# Patient Record
Sex: Male | Born: 1940 | Race: Black or African American | Hispanic: No | Marital: Single | Smoking: Never smoker
Health system: Southern US, Community
[De-identification: ages and names within clinical notes are randomized; demographics above are authoritative.]

## PROBLEM LIST (undated history)

## (undated) DIAGNOSIS — R569 Unspecified convulsions: Secondary | ICD-10-CM

## (undated) DIAGNOSIS — N390 Urinary tract infection, site not specified: Secondary | ICD-10-CM

## (undated) HISTORY — DX: Urinary tract infection, site not specified: N39.0

## (undated) HISTORY — DX: Unspecified convulsions: R56.9

---

## 1989-05-07 DIAGNOSIS — R569 Unspecified convulsions: Secondary | ICD-10-CM

## 1989-05-07 HISTORY — DX: Unspecified convulsions: R56.9

## 2005-07-06 ENCOUNTER — Ambulatory Visit: Payer: Self-pay

## 2005-12-03 ENCOUNTER — Ambulatory Visit: Payer: Self-pay | Admitting: Cardiology

## 2006-03-01 ENCOUNTER — Ambulatory Visit: Payer: Self-pay | Admitting: Cardiology

## 2006-03-15 ENCOUNTER — Encounter: Payer: Self-pay | Admitting: Cardiology

## 2006-03-15 ENCOUNTER — Ambulatory Visit: Payer: Self-pay

## 2006-04-17 ENCOUNTER — Ambulatory Visit: Payer: Self-pay | Admitting: Cardiology

## 2006-08-30 ENCOUNTER — Ambulatory Visit: Payer: Self-pay | Admitting: Cardiology

## 2007-02-21 ENCOUNTER — Ambulatory Visit: Payer: Self-pay | Admitting: Cardiology

## 2008-02-20 ENCOUNTER — Ambulatory Visit: Payer: Self-pay | Admitting: Cardiology

## 2009-01-18 ENCOUNTER — Encounter (INDEPENDENT_AMBULATORY_CARE_PROVIDER_SITE_OTHER): Payer: Self-pay | Admitting: *Deleted

## 2009-03-02 DIAGNOSIS — D649 Anemia, unspecified: Secondary | ICD-10-CM

## 2009-03-02 DIAGNOSIS — E119 Type 2 diabetes mellitus without complications: Secondary | ICD-10-CM

## 2009-03-02 DIAGNOSIS — I10 Essential (primary) hypertension: Secondary | ICD-10-CM | POA: Insufficient documentation

## 2009-03-03 ENCOUNTER — Encounter: Payer: Self-pay | Admitting: Cardiology

## 2009-03-03 DIAGNOSIS — E785 Hyperlipidemia, unspecified: Secondary | ICD-10-CM

## 2009-03-03 DIAGNOSIS — I2510 Atherosclerotic heart disease of native coronary artery without angina pectoris: Secondary | ICD-10-CM | POA: Insufficient documentation

## 2009-03-04 ENCOUNTER — Ambulatory Visit: Payer: Self-pay | Admitting: Cardiology

## 2009-05-09 ENCOUNTER — Ambulatory Visit: Payer: Self-pay | Admitting: Cardiology

## 2009-05-09 ENCOUNTER — Encounter: Payer: Self-pay | Admitting: Cardiology

## 2009-05-09 ENCOUNTER — Ambulatory Visit: Payer: Self-pay

## 2009-05-09 ENCOUNTER — Ambulatory Visit (HOSPITAL_COMMUNITY): Admission: RE | Admit: 2009-05-09 | Discharge: 2009-05-09 | Payer: Self-pay | Admitting: Cardiology

## 2009-05-20 ENCOUNTER — Telehealth (INDEPENDENT_AMBULATORY_CARE_PROVIDER_SITE_OTHER): Payer: Self-pay | Admitting: *Deleted

## 2010-06-06 NOTE — Progress Notes (Signed)
  Phone Note Other Incoming   Caller: Okey Regal Reason for Call: Get patient information Action Taken: Information Sent Initial call taken by: Denny Peon    Faxed 12 lead over to [facility] office to fax [phone] [facility]  May 20, 2009 12:59 PM

## 2010-09-19 NOTE — Assessment & Plan Note (Signed)
[facility]ALTHCARE                            CARDIOLOGY OFFICE NOTE   KYION, GAUTIER                      MRN:          [id number]  DATE:02/20/2008                            DOB:          05/27/1940    Mr. Eric Grant is here for the followup of his left ventricular  dysfunction, hypertension, and hypercholesterolemia.  His cholesterol is  being treated aggressively by Dr. Lucianne Muss.  His hypertension is under good  control with his current medicines.  He is not having any significant  symptoms.  He does have some left ventricular dysfunction.  We are aware  of this.  He has never want to undergo coronary angiography.  He has  suspected coronary artery disease.  We are treating him as such.  He  does not have chest pain.  There is no shortness of breath.  There has  been no syncope or presyncope.   PAST MEDICAL HISTORY:   ALLERGIES:  No known drug allergies.   MEDICATIONS:  1. NovoLog.  2. Lantus.  3. ACTOplus met.  4. Lipitor.  5. Aspirin.  6. Carvedilol 12.5 b.i.d.  7. Iron.  8. Diovan.  9. Hydrochlorothiazide.  10.Nexium.   OTHER MEDICAL PROBLEMS:  See the list on the note of February 21, 2007.   REVIEW OF SYSTEMS:  He has no GI or GU symptoms.  He has no  musculoskeletal problems.  He has no fevers or chills.  Otherwise review  of systems is negative.   PHYSICAL EXAMINATION:  VITAL SIGNS:  Blood pressure is 122/72 with pulse  of 58.  GENERAL:  The patient is oriented to person, time, and place.  Affect is  normal.  HEENT:  No xanthelasma.  He has normal extraocular motion.  NECK:  There are no carotid bruits.  There is no jugular venous  distention.  LUNGS:  Clear.  Respiratory effort is not labored.  CARDIAC:  S1 and S2.  There are no clicks or significant murmurs.  ABDOMEN:  Soft.  He has no significant peripheral edema.   PROBLEMS:  1. History of anemia and guaiac negative stool by Dr. Lucianne Muss in the      past.  2. Hypertension  treated.  3. Smoking, stopped in 1990.  4. Diabetes, treated.  5. Hypercholesterolemia, treated.  6. History of ejection fraction 40%.  The patient is on all of the      appropriate medicines.  I have chosen not to repeat his echo at      this time.  7. History of a Myoview in 2007, suggesting some inferolateral scar.      We are assuming coronary artery disease.  We have had many      discussions and the patient does not want to undergo      catheterization and this is totally appropriate.  We are treating      him with all the appropriate medications.     Luis Abed, MD, [facility]  Electronically Signed    JDK/MedQ  DD: 02/20/2008  DT: 02/20/2008  Job #: [id number]   cc:   Reather Littler, M.D.

## 2010-09-19 NOTE — Assessment & Plan Note (Signed)
[facility] HEALTHCARE                            CARDIOLOGY OFFICE NOTE   Eric Grant                      MRN:          [id number]  DATE:02/21/2007                            DOB:          18-Nov-1940    Eric Grant is doing great. We know that he has a wall motion  abnormality. We suspect that he has coronary disease. He has been  stable. He is not having any significant symptoms. He has never  undergone catheterization. I have suggested this over time, but he has  been hesitant and I am very comfortable following him clinically with  his excellent care from Dr. Lucianne Muss. He continues to work helping to [employer] and [employer]. He is not having any chest  pain.   PAST MEDICAL HISTORY:   ALLERGIES:  No known drug allergies.   MEDICATIONS:  1. NovoLog.  2. Lantus.  3. Diovan hydrochlorothiazide.  4. Actoplus.  5. Lipitor.  6. Aspirin.  7. Carvedilol 12.5 mg b.i.d.   OTHER MEDICAL PROBLEMS:  See the list below.   REVIEW OF SYSTEMS:  He feels fine and his review of systems is negative.   PHYSICAL EXAMINATION:  VITAL SIGNS:  Weight 227 pounds. He is up a few  pounds since last year and he needs to lose weight. Blood pressure  130/80 with a pulse of 58.  GENERAL:  The patient is person, time, and place. Affect is normal.  HEENT:  Reveals no xanthelasma. He has normal extraocular motion.  NECK:  There are no carotid bruits. There is no jugular venous  distension.  LUNGS:  Clear. Respiratory effort is not labored.  CARDIAC:  Reveals an S1 with an S2. There are no clicks or significant  murmurs.  ABDOMEN:  Soft. He has no masses or bruits.  EXTREMITIES:  He has no peripheral edema.   EKG reveals increased voltage, but no significant change since last  year.   PROBLEMS:  1. History of anemia with a guaiac negative stool by Dr. Lucianne Muss in the      past.  2. Hypertension, treated.  3. Smoking stopped in 1990.  4.  Diabetes, treated.  5. Hypercholesterolemia, treated.  6. Ejection fraction 40%. The patient is on the appropriate medicines.      Dr. Lucianne Muss very nicely followed through and weaned the patient off      of Diltiazem and increased his Carvedilol dose and he is doing      well. No changes at this time.  7. History of a Myoview in 07/2005 suggesting inferolateral scar. We      will treat him as if he has coronary artery disease. The most      important approach will be all of the things that are being done      about his secondary prevention methods. He is doing well and I will      see him back in one year. No testing is needed.     Eric Abed, MD, [facility]  Electronically Signed    JDK/MedQ  DD: 02/21/2007  DT: 02/22/2007  Job #: [id number]   cc:   Eric Grant, M.D.

## 2010-09-22 NOTE — Assessment & Plan Note (Signed)
[facility]ALTHCARE                            CARDIOLOGY OFFICE NOTE   HEZAKIAH, CHAMPEAU                      MRN:          [id number]  DATE:04/17/2006                            DOB:          [birth year]    Mr. Fidler is seen for cardiology followup.  See my note of March 01, 2006.  At that time, we decided to proceed with a followup echo.  The ejection fraction was read in the 35 to 45%, with overall probably  40%, and I certainly agree with this.  He does have some mild effuse  hypokinesis, however, he has akinesis of the posterior wall.  This  suggests that he may well have coronary disease.   Patient is not having any chest pain or shortness of breath.  I talked  with him about the fact that I would like to proceed to cardiac  catheterization.  He is strongly considering, but I will see him back in  January and he will make his decision at that time.  I  have considered  also whether we should change his meds further, but I have chosen not  to.  We will need, over time, to push his Carvedilol dose up, and try to  get his diltiazem dose off.   PAST MEDICAL HISTORY:   ALLERGIES:  NO KNOWN DRUG ALLERGIES.   MEDICATIONS:  1. NovoLog.  2. Lantus.  3. Diovan.  4. Hydrochlorothiazide.  5. Ecto plus.  6. Diltiazem.  7. Lipitor.  8. Aspirin.  9. Carvedilol 6.25 b.i.d.   OTHER MEDICAL PROBLEMS:  See the list below.   REVIEW OF SYSTEMS:  He feels fine and has no problems.  The review of  systems is negative today.   PHYSICAL EXAMINATION:  Weight is 228 pounds, this is up 4 pounds, and I  have talked with him about losing weight.  Blood pressure is 122/80 with  a pulse of 63.  The patient is oriented to person, time, and place, and his affect is  normal.  LUNGS:  Clear.  Respiratory effort is not labored.  CARDIAC EXAM:  Reveals S1 with an S2.  There are no clicks or  significant murmurs.  ABDOMEN:  Benign.  He has no significant  peripheral edema.   No labs are done today.   PROBLEMS:  1. History of mild anemia with a guaiac negative stool by Dr. Lucianne Muss in      the past.  2. Hypertension, treated.  3. Smoking, stopped in 1990.  4. Diabetes.  5. Hypercholesterolemia.  6. Probably coronary disease by abnormal Cardiolite.  7. Ejection fraction in the 40% range.  8. 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   The patient is on appropriate medications, although it would be optimal  to lower his Cardizem and increase his Carvedilol.  I have chosen not to  do that as of today as I would prefer not to change his meds close to  the time of a heart catheterization.  We will see him back and proceed  to talk further about  cath.     Luis Abed, MD, [facility]  Electronically Signed    JDK/MedQ  DD: 04/17/2006  DT: 04/17/2006  Job #: [id number]   cc:   Reather Littler, M.D.

## 2010-09-22 NOTE — Assessment & Plan Note (Signed)
[facility] HEALTHCARE                            CARDIOLOGY OFFICE NOTE   SAMPSON, Eric Grant                      MRN:          [id number]  DATE:08/30/2006                            DOB:          09-30-1940    Mr. Bergerson is seen for the followup of his cardiac status.  I saw him  last on April 17, 2006.  The patient has wall motion abnormalities  and an ejection fraction in the 40% range.  He is doing well.  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  He is  following his medications very carefully.  He follows with Dr. Lucianne Muss  very carefully.  He has not had any chest pain.  There is no shortness  of breath, syncope or presyncope.   PAST MEDICAL HISTORY:   ALLERGIES:  No known drug allergies.   MEDICATIONS:  1. NovoLog.  2. Lantus.  3. Diovan HCT.  4. Actoplus Met.  5. Diltiazem CD.  6. Lipitor 40 mg.  7. Aspirin 81 mg.  8. Carvedilol 6.25 mg b.i.d.   OTHER MEDICAL PROBLEMS:  See the list below.   REVIEW OF SYSTEMS:  He feels fine and has no complaints.   PHYSICAL EXAMINATION:  Weight is 223 pounds.  Blood pressure 136/78.  At  home, he checks his pressure and it is below 130 systolic all of the  time.  Heart rate is 59.  The patient is oriented to person, time and place.  Affect is normal.  HEENT:  No xanthelasma.  He has normal extraocular motion.  There are no carotid bruits.  There is no jugular venous distention.  LUNGS:  Clear.  Respiratory effort is not labored.  CARDIAC:  Exam reveals an S1 with an S2.  There are no clicks or  significant murmurs.  ABDOMEN:  Soft.  He has normal bowel sounds.  He has no peripheral  edema.   EKG:  Reveals no significant change.   PROBLEMS:  1. History of anemia with guaiac-negative stool by Dr. Lucianne Muss in the      past.  2. Hypertension, treated.  3. Smoking, stopped in 1990.  4. Diabetes,  treated.  5. Hypercholesterolemia, treated.  6. Ejection 40% range.  The patient is on an angiotensin II receptor      blocker; he is also on low-dose carvedilol.  He is on some      diltiazem.  It would be optimal to try to wean him off of diltiazem      and increase his carvedilol dose slowly up to 25 mg b.i.d.  I did      not make any changes today.  When he sees Dr. Lucianne Muss, consideration      could be given to being sure that his carvedilol is 6.25 b.i.d. and      not once daily.  From there, over several weeks, his dose could be      increased to 12.5 b.i.d. and left in that range.  7. Myoview  scan in March 2007 suggesting inferolateral scar.  There      was some peri-infarct ischemic.   Once again, I spoke with Mr. Elk about the possibility of cardiac  catheterization.  He is active and has no significant symptoms.  He is  getting all of the appropriate treatment.  He would prefer to be  followed medically.  This is a very reasonable approach, as long as he  is willing to be very careful with his medications and to contact us if  he has any problems.   I will see him back in 6 months.     Luis Abed, MD, [facility]  Electronically Signed    JDK/MedQ  DD: 08/30/2006  DT: 08/30/2006  Job #: [id number]   cc:   Reather Littler, M.D.

## 2010-09-22 NOTE — Assessment & Plan Note (Signed)
[facility] HEALTHCARE                              CARDIOLOGY OFFICE NOTE   Eric Grant                      MRN:          [id number]  DATE:12/03/2005                            DOB:          1941/01/14    HISTORY OF PRESENT ILLNESS:  Eric Grant is 70 years old.  He is a very  pleasant gentleman and he is followed by Dr. Reather Littler who has referred him  for cardiac evaluation.  The patient has noted some increased heart rate.  He has not had chest pain or shortness of breath.  Dr. Lucianne Muss had ordered a  nuclear exercise test, and in March of 2007, the study showed that the  patient had a definite focal area of injury with an ejection fraction  estimated in the 34% range and severe inferior hypokinesis.  The patient had  some mild ischemia in the area of the scar but no ischemia in other  distributions.  Cardiac evaluation had been recommended to the patient at  that time, but there were delays on the patient's part, but he is now here  for evaluation.  He is not having any chest pain or shortness of breath.  He  has had no palpitations.  There is no syncope or presyncope.  He does have  diabetes, hypertension, and hypercholesterolemia that are being expertly  treated by Dr. Lucianne Muss.   PAST MEDICAL HISTORY:  1.  Allergies:  NO KNOWN DRUG ALLERGIES.  2.  Medications:  NovoLog, Lantus, Diovan/hydrochlorothiazide 160/12.5,      Actoplus 15/180 twice a day, Diltiazem CD 180, Lipitor 40, and aspirin      81 mg.  3.  Other medical problems:  See the list below.   SOCIAL HISTORY:  The patient is single.  He smoked in the past but stopped  in 1990.  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   FAMILY HISTORY:  There is no strong family history of coronary disease.   REVIEW OF SYSTEMS:  The patient has no significant GI or GU symptoms.  Overall, he feels well and his review of systems is negative.   PHYSICAL  EXAMINATION:  VITAL SIGNS:  Blood pressure is 140/77 today.  The  pulse is 75.  GENERAL:  The patient is oriented to person, time, and place.  Affect is  normal.  LUNGS:  Clear.  Respiratory effort is not labored.  HEENT:  Reveals no xanthelasma.  He has normal extraocular motion.  There  are no carotid bruits.  There is no jugular venous distension.  CARDIAC:  Reveals an S1 with an S2.  There are no clicks or significant  murmurs.  ABDOMEN:  Soft.  There are no masses or bruits.  EXTREMITIES:  There is no peripheral edema.   LABORATORY DATA:  EKG reveals sinus rhythm.  He has voltage criteria for  left ventricular hypertrophy.  With his sinus rhythm, he has some mild sinus  arrhythmia.  The nuclear scan was done on July 06, 2005.  The study showed  that he exercised for  5 minutes.  He had shortness of breath.  There were no  marked EKG changes.  There was a moderate inferolateral defect, and there  was mild ischemia in this area.  The ejection fraction was 34%.  It was felt  that this finding was compatible with a scar with some superimposed  ischemia.  There were wall motion abnormalities and some increased  ventricular size.   PROBLEMS:  1.  History of a mild anemia with guaiac negative stool in the past, per Dr.      Lucianne Muss.  2.  Hypertension.  Blood pressure is under reasonable control, although I am      sure that with his diabetes, Dr. Lucianne Muss would like the pressure even      lower.  3.  History of smoking, stopped in 1990.  4.  Diabetes.  5.  Hypercholesterolemia, being treated.  6.  Probable coronary disease with abnormal Cardiolite.  7.  Ejection fraction 37% by Cardiolite.  8.  Inferolateral scar with mild associated ischemia by Cardiolite.  9.  Some mild increased heart rate at rest over time.   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  I explained this  to him at great length.  He is not having any significant symptoms.  He does  need more complete  evaluation at this time.  I explained to him at great  length that it would be helpful to proceed with cardiac catheterization so  that we could know his anatomy.  We also need to assess his LV function  further over time and make decisions as to whether or not he needs  protection from potential significant arrhythmias.  After a very complete  discussion, he said that he would consider catheterization but probably  later in the year.  He does have follow-up with Dr. Lucianne Muss and I made it  clear that this note would go to Dr. Lucianne Muss, and then he could discuss things  further with Dr. Lucianne Muss.  I did start him on low-dose beta blocker.  We  started Coreg 6.25 mg twice a day.  I would suggest titrating this up as  tolerated to 25 mg twice a day and trying over time to see if his diltiazem  could be discontinued if possible.  The diltiazem is not optimal for his  left ventricular dysfunction.  The patient is on an ARB which, of course, is  appropriate.  Overall treatment for his LV dysfunction would include aspirin  (he is on), ARB (he is taking beta blocker started today).  I do not think  that the patient should be started on spironolactone or __________  at this  time.  Of course, ongoing consideration of whether or not he is a candidate  for an ICD is appropriate.  We cannot assess this further until he undergoes  heart catheterization.  At some point, 2-D echo to assess LV function  further would also be appropriate.  This would be optimal after he is on  full-dose Coreg.                               Eric Abed, MD, [facility]    JDK/MedQ  DD:  12/03/2005  DT:  12/03/2005  Job #:  [id number]   cc:   Reather Littler, MD

## 2010-09-22 NOTE — Assessment & Plan Note (Signed)
[facility] HEALTHCARE                              CARDIOLOGY OFFICE NOTE   DREZDEN, SEITZINGER                      MRN:          [id number]  DATE:03/01/2006                            DOB:          [birth year]    Mr. Mennenga is seen for followup.  I had seen him in July of 2007.  He had  had a nuclear scan in March of 2007 suggesting a decreased ejection  fraction, and I felt that strong consideration should be given to cardiac  catheterization.  Also to push medications for left ventricular dysfunction.  The patient is on an ARB, and he is on carvedilol 6.25 mg b.i.d.  He is  stable.  He is not having chest pain or shortness of breath.  He has not had  any syncope or presyncope.   ALLERGIES:  No known drug allergies.   MEDICATIONS:  1. NovoLog.  2. Lantus.  3. Diovan hydrochlorothiazide.  4. Actoplus Met.  5. Diltiazem.  6. Lipitor.  7. Aspirin.  8. Carvedilol 6.25 mg b.i.d.   OTHER MEDICAL PROBLEMS:  See the list below.   REVIEW OF SYSTEMS:  He feels fine, and his review of systems is negative.   PHYSICAL EXAMINATION:  GENERAL:  The patient is oriented to person, time and  place, and his affect is normal.  HEENT:  Reveals no xanthelasma.  He has normal extraocular motion.  There  are no carotid bruits.  There is no jugular venous distention.  LUNGS:  Clear.  Respiratory effort is not labored.  CARDIAC:  Exam reveals an S1 with an S2. There are no clicks or significant  murmurs.  ABDOMEN:  Soft.  There are no masses or bruits.  He has no peripheral edema.   EKG reveals no significant change.   PROBLEM LIST:  1. History of mild anemia with a guaiac-negative stool by Dr. Lucianne Muss in the      past.  2. Hypertension, reasonable control.  3. Smoking.  Stopped in 1990.  4. Diabetes.  5. Hypercholesterolemia.  6. Probable coronary disease with an abnormal Cardiolite in the past.  7. History of ejection fraction of 37% by Cardiolite.  8.  Inferolateral scar with mild associated ischemia by Cardiolite.   I had a nice discussion with Mr. Simson today.  I explained to him that  doing a 2D echo would help [place] reassess his left ventricular function and  make further decisions for many issues.  He is in agreement.  I will then  see him back.  He did say that if, after the echo test, it was my feeling  that cardiac catheterization should be done, that he would strongly consider  that in the future.  No change in his medications today.    ______________________________  Luis Abed, MD, [facility]    JDK/MedQ  DD: 03/01/2006  DT: 03/02/2006  Job #: [id number]   cc:   Reather Littler, M.D.

## 2011-06-22 DIAGNOSIS — E559 Vitamin D deficiency, unspecified: Secondary | ICD-10-CM | POA: Diagnosis not present

## 2011-06-22 DIAGNOSIS — D649 Anemia, unspecified: Secondary | ICD-10-CM | POA: Diagnosis not present

## 2011-06-22 DIAGNOSIS — E119 Type 2 diabetes mellitus without complications: Secondary | ICD-10-CM | POA: Diagnosis not present

## 2011-06-22 DIAGNOSIS — E7800 Pure hypercholesterolemia, unspecified: Secondary | ICD-10-CM | POA: Diagnosis not present

## 2011-06-29 DIAGNOSIS — E559 Vitamin D deficiency, unspecified: Secondary | ICD-10-CM | POA: Diagnosis not present

## 2011-06-29 DIAGNOSIS — I10 Essential (primary) hypertension: Secondary | ICD-10-CM | POA: Diagnosis not present

## 2011-06-29 DIAGNOSIS — Z0000 Encounter for general adult medical examination without abnormal findings: Secondary | ICD-10-CM | POA: Diagnosis not present

## 2011-06-29 DIAGNOSIS — E7800 Pure hypercholesterolemia, unspecified: Secondary | ICD-10-CM | POA: Diagnosis not present

## 2011-06-29 DIAGNOSIS — D649 Anemia, unspecified: Secondary | ICD-10-CM | POA: Diagnosis not present

## 2011-06-29 DIAGNOSIS — E119 Type 2 diabetes mellitus without complications: Secondary | ICD-10-CM | POA: Diagnosis not present

## 2011-06-29 DIAGNOSIS — I498 Other specified cardiac arrhythmias: Secondary | ICD-10-CM | POA: Diagnosis not present

## 2011-11-23 DIAGNOSIS — D649 Anemia, unspecified: Secondary | ICD-10-CM | POA: Diagnosis not present

## 2011-11-23 DIAGNOSIS — E119 Type 2 diabetes mellitus without complications: Secondary | ICD-10-CM | POA: Diagnosis not present

## 2011-11-23 DIAGNOSIS — E7800 Pure hypercholesterolemia, unspecified: Secondary | ICD-10-CM | POA: Diagnosis not present

## 2011-11-23 DIAGNOSIS — E559 Vitamin D deficiency, unspecified: Secondary | ICD-10-CM | POA: Diagnosis not present

## 2011-11-30 DIAGNOSIS — M1710 Unilateral primary osteoarthritis, unspecified knee: Secondary | ICD-10-CM | POA: Diagnosis not present

## 2011-11-30 DIAGNOSIS — D649 Anemia, unspecified: Secondary | ICD-10-CM | POA: Diagnosis not present

## 2011-11-30 DIAGNOSIS — E119 Type 2 diabetes mellitus without complications: Secondary | ICD-10-CM | POA: Diagnosis not present

## 2011-11-30 DIAGNOSIS — I10 Essential (primary) hypertension: Secondary | ICD-10-CM | POA: Diagnosis not present

## 2011-11-30 DIAGNOSIS — E559 Vitamin D deficiency, unspecified: Secondary | ICD-10-CM | POA: Diagnosis not present

## 2011-12-14 DIAGNOSIS — H40019 Open angle with borderline findings, low risk, unspecified eye: Secondary | ICD-10-CM | POA: Diagnosis not present

## 2012-04-23 DIAGNOSIS — E119 Type 2 diabetes mellitus without complications: Secondary | ICD-10-CM | POA: Diagnosis not present

## 2012-04-23 DIAGNOSIS — D649 Anemia, unspecified: Secondary | ICD-10-CM | POA: Diagnosis not present

## 2012-05-02 DIAGNOSIS — D649 Anemia, unspecified: Secondary | ICD-10-CM | POA: Diagnosis not present

## 2012-05-02 DIAGNOSIS — Z23 Encounter for immunization: Secondary | ICD-10-CM | POA: Diagnosis not present

## 2012-05-02 DIAGNOSIS — I10 Essential (primary) hypertension: Secondary | ICD-10-CM | POA: Diagnosis not present

## 2012-05-02 DIAGNOSIS — E119 Type 2 diabetes mellitus without complications: Secondary | ICD-10-CM | POA: Diagnosis not present

## 2012-05-02 DIAGNOSIS — E7800 Pure hypercholesterolemia, unspecified: Secondary | ICD-10-CM | POA: Diagnosis not present

## 2012-06-06 DIAGNOSIS — E119 Type 2 diabetes mellitus without complications: Secondary | ICD-10-CM | POA: Diagnosis not present

## 2012-07-05 DIAGNOSIS — N390 Urinary tract infection, site not specified: Secondary | ICD-10-CM

## 2012-07-05 HISTORY — DX: Urinary tract infection, site not specified: N39.0

## 2012-07-28 DIAGNOSIS — E119 Type 2 diabetes mellitus without complications: Secondary | ICD-10-CM | POA: Diagnosis not present

## 2012-07-29 DIAGNOSIS — N390 Urinary tract infection, site not specified: Secondary | ICD-10-CM | POA: Diagnosis not present

## 2013-01-07 ENCOUNTER — Telehealth: Payer: Self-pay | Admitting: Endocrinology

## 2013-01-07 ENCOUNTER — Other Ambulatory Visit: Payer: Self-pay | Admitting: *Deleted

## 2013-01-07 MED ORDER — GLUCOSE BLOOD VI STRP: ORAL_STRIP | Status: DC

## 2013-01-07 NOTE — Telephone Encounter (Signed)
rx sent

## 2013-03-24 ENCOUNTER — Other Ambulatory Visit: Payer: Self-pay | Admitting: *Deleted

## 2013-03-27 ENCOUNTER — Other Ambulatory Visit: Payer: Self-pay | Admitting: *Deleted

## 2013-03-27 ENCOUNTER — Other Ambulatory Visit (INDEPENDENT_AMBULATORY_CARE_PROVIDER_SITE_OTHER): Payer: MEDICARE

## 2013-03-27 DIAGNOSIS — E119 Type 2 diabetes mellitus without complications: Secondary | ICD-10-CM | POA: Diagnosis not present

## 2013-03-27 DIAGNOSIS — E785 Hyperlipidemia, unspecified: Secondary | ICD-10-CM | POA: Diagnosis not present

## 2013-03-27 LAB — COMPREHENSIVE METABOLIC PANEL
Alkaline Phosphatase: 68 U/L (ref 39–117)
BUN: 14 mg/dL (ref 6–23)
Glucose, Bld: 88 mg/dL (ref 70–99)
Sodium: 136 mEq/L (ref 135–145)
Total Bilirubin: 1 mg/dL (ref 0.3–1.2)

## 2013-03-27 LAB — URINALYSIS
Ketones, ur: NEGATIVE
Leukocytes, UA: NEGATIVE
Nitrite: NEGATIVE
Specific Gravity, Urine: 1.02 (ref 1.000–1.030)
Total Protein, Urine: NEGATIVE
Urobilinogen, UA: 0.2 (ref 0.0–1.0)

## 2013-03-27 LAB — MICROALBUMIN / CREATININE URINE RATIO
Creatinine,U: 130.4 mg/dL
Microalb Creat Ratio: 3.9 mg/g (ref 0.0–30.0)

## 2013-03-27 LAB — LIPID PANEL
HDL: 34.7 mg/dL — ABNORMAL LOW (ref >39.00)
Total CHOL/HDL Ratio: 3

## 2013-03-31 ENCOUNTER — Encounter: Payer: Self-pay | Admitting: Endocrinology

## 2013-03-31 ENCOUNTER — Ambulatory Visit (INDEPENDENT_AMBULATORY_CARE_PROVIDER_SITE_OTHER): Payer: MEDICARE | Admitting: Endocrinology

## 2013-03-31 ENCOUNTER — Other Ambulatory Visit: Payer: Self-pay | Admitting: *Deleted

## 2013-03-31 VITALS — BP 128/82 | HR 67 | Temp 98.5°F | Resp 12 | Ht 71.0 in | Wt 236.0 lb

## 2013-03-31 DIAGNOSIS — E119 Type 2 diabetes mellitus without complications: Secondary | ICD-10-CM | POA: Diagnosis not present

## 2013-03-31 DIAGNOSIS — I10 Essential (primary) hypertension: Secondary | ICD-10-CM

## 2013-03-31 DIAGNOSIS — E785 Hyperlipidemia, unspecified: Secondary | ICD-10-CM | POA: Diagnosis not present

## 2013-03-31 DIAGNOSIS — D649 Anemia, unspecified: Secondary | ICD-10-CM

## 2013-03-31 DIAGNOSIS — N390 Urinary tract infection, site not specified: Secondary | ICD-10-CM

## 2013-03-31 MED ORDER — PIOGLITAZONE HCL-METFORMIN HCL 15-850 MG PO TABS: 1.0000 | ORAL_TABLET | Freq: Two times a day (BID) | ORAL | Status: DC

## 2013-03-31 NOTE — Patient Instructions (Signed)
Please check blood sugars at least half the time about 2 hours after any meal and as directed on waking up. Please bring blood sugar monitor to each visit  Exercise, check weight monthly

## 2013-03-31 NOTE — Progress Notes (Signed)
Patient ID: Eric Grant, male   DOB: 10/03/1940, 72 y.o.   MRN: [id number]   Reason for Appointment: Diabetes follow-up   History of Present Illness   Diagnosis: Type 2 DIABETES MELITUS, date of diagnosis:  1988     Previous history: He has been on basal bolus insulin regimen for several years with excellent control He takes mealtime insulin only at breakfast and lunch Also has been taking Actoplusmet with good results for several years and no side effects He was last seen in 12/13 with an A1c of 5.9%.  Recent history: He is again checking his blood sugars mostly in the mornings on waking up in no readings after meals as discussed on every visit His morning sugars are overall fairly good and averaging below 100 but will have sporadic high readings which he thinks are from eating late at night or eating more. Has not changed his insulin regimen since his last visit     Oral hypoglycemic drugs:   Actoplusmet   twice a day      Side effects from medications: None Insulin regimen:   LANTUS 18 units in the morning. HUMALOG insulin insulin 6 units in the morning and 15 at supper     Proper timing of medications in relation to meals: Yes.          Monitors blood glucose: Once a day.    Glucometer: One Touch.          Blood Glucose readings from meter download: readings before breakfast: Median 92 with a range 73-163. No readings after meals  Hypoglycemia frequency:  rare         Meals: 3 meals per day.          Physical activity: exercise: less recently           Weight control: He has gained 9 pounds over the last year  Wt Readings from Last 3 Encounters:  03/31/13 236 lb (107.049 kg)  03/04/09 228 lb (103.42 kg)          Complications:  none   Diabetes labs:  Lab Results  Component Value Date   HGBA1C 6.3 03/27/2013   Lab Results  Component Value Date   MICROALBUR 5.1* 03/27/2013   LDLCALC 60 03/27/2013   CREATININE 1.3 03/27/2013      Medication List       This  list is accurate as of: 03/31/13 11:59 PM.  Always use your most recent med list.               aspirin 81 MG tablet  Take 81 mg by mouth daily.     B-D ULTRAFINE III SHORT PEN 31G X 8 MM Misc  Generic drug:  Insulin Pen Needle     carvedilol 12.5 MG tablet  Commonly known as:  COREG  Take 12.5 mg by mouth 2 (two) times daily with a meal.     cholecalciferol 400 UNITS Tabs tablet  Commonly known as:  VITAMIN D  Take 400 Units by mouth.     ferrous fumarate-iron polysaccharide complex 162-115.2 MG Caps  Commonly known as:  TANDEM  Take 1 capsule by mouth daily with breakfast. 2 tablets daily     glucose blood test strip  Commonly known as:  ONETOUCH VERIO  Use as instructed to check blood sugars 4 times per day dx code 250.00     HUMALOG KWIKPEN 100 UNIT/ML Sopn  Generic drug:  insulin lispro  2 (two) times daily. Takes 6  units at breakfast and 15 units at night     LANTUS SOLOSTAR [place]  Inject 18 Units into the skin. 18 units in a.m.     pioglitazone-metformin 15-850 MG per tablet  Commonly known as:  ACTOPLUS MET  Take 1 tablet by mouth 2 (two) times daily with a meal.     rosuvastatin 20 MG tablet  Commonly known as:  CRESTOR  Take 20 mg by mouth daily.     valsartan-hydrochlorothiazide 320-12.5 MG per tablet  Commonly known as:  DIOVAN-HCT        Allergies: No Known Allergies  Past Medical History  Diagnosis Date  . Seizures 1991  . Urinary tract infection 07/2012    No past surgical history on file.  Family History  Problem Relation Age of Onset  . Hyperlipidemia Mother   . Diabetes Mother   . Hypertension Mother   . Diabetes Father   . Hypertension Father   . Cancer Neg Hx     Social History:  reports that he has never smoked. He has never used smokeless tobacco. His alcohol and drug histories are not on file.  Review of Systems:  Hypertension:  has had long-standing hypertension, currently taking Diovan HCTZ and carvedilol.  BP at work  recently 117/80  Lipids: LDL is excellent although HDL is still relatively low with current regimen of Crestor 20 mg  Preventive care: Normal Hemocult in 4/13, he thinks he is due for colonoscopy next year. Has had mild chronic anemia in the past, last hemoglobin 12.5; has been supplemented with Tandem iron for the iron deficiency    LABS:  Appointment on 03/27/2013  Component Date Value Range Status  . Hemoglobin A1C 03/27/2013 6.3  4.6 - 6.5 % Final   Glycemic Control Guidelines for People with Diabetes:Non Diabetic:  <6%Goal of Therapy: <7%Additional Action Suggested:  >8%   . Sodium 03/27/2013 136  135 - 145 mEq/L Final  . Potassium 03/27/2013 4.2  3.5 - 5.1 mEq/L Final  . Chloride 03/27/2013 102  96 - 112 mEq/L Final  . CO2 03/27/2013 29  19 - 32 mEq/L Final  . Glucose, Bld 03/27/2013 88  70 - 99 mg/dL Final  . BUN 13/12/6576 14  6 - 23 mg/dL Final  . Creatinine, Ser 03/27/2013 1.3  0.4 - 1.5 mg/dL Final  . Total Bilirubin 03/27/2013 1.0  0.3 - 1.2 mg/dL Final  . Alkaline Phosphatase 03/27/2013 68  39 - 117 U/L Final  . AST 03/27/2013 15  0 - 37 U/L Final  . ALT 03/27/2013 14  0 - 53 U/L Final  . Total Protein 03/27/2013 7.7  6.0 - 8.3 g/dL Final  . Albumin 46/96/2952 3.7  3.5 - 5.2 g/dL Final  . Calcium 84/13/2440 9.1  8.4 - 10.5 mg/dL Final  . GFR 03/03/2535 58.59* >60.00 mL/min Final  . Color, Urine 03/27/2013 LT. YELLOW  Yellow;Lt. Yellow Final  . APPearance 03/27/2013 CLEAR  Clear Final  . Specific Gravity, Urine 03/27/2013 1.020  1.000-1.030 Final  . pH 03/27/2013 6.0  5.0 - 8.0 Final  . Total Protein, Urine 03/27/2013 NEGATIVE  Negative Final  . Urine Glucose 03/27/2013 NEGATIVE  Negative Final  . Ketones, ur 03/27/2013 NEGATIVE  Negative Final  . Bilirubin Urine 03/27/2013 NEGATIVE  Negative Final  . Hgb urine dipstick 03/27/2013 NEGATIVE  Negative Final  . Urobilinogen, UA 03/27/2013 0.2  0.0 - 1.0 Final  . Leukocytes, UA 03/27/2013 NEGATIVE  Negative Final  .  Nitrite 03/27/2013 NEGATIVE  Negative Final  . Microalb, Ur 03/27/2013 5.1* 0.0 - 1.9 mg/dL Final  . Creatinine,U 16/02/9603 130.4   Final  . Microalb Creat Ratio 03/27/2013 3.9  0.0 - 30.0 mg/g Final  . Cholesterol 03/27/2013 112  0 - 200 mg/dL Final   ATP III Classification       Desirable:  < 200 mg/dL               Borderline High:  200 - 239 mg/dL          High:  > = 540 mg/dL  . Triglycerides 03/27/2013 89.0  0.0 - 149.0 mg/dL Final   Normal:  <981 mg/dLBorderline High:  150 - 199 mg/dL  . HDL 03/27/2013 34.70* >39.00 mg/dL Final  . VLDL 19/14/7829 17.8  0.0 - 40.0 mg/dL Final  . LDL Cholesterol 03/27/2013 60  0 - 99 mg/dL Final  . Total CHOL/HDL Ratio 03/27/2013 3   Final                  Men          Women1/2 Average Risk     3.4          3.3Average Risk          5.0          4.42X Average Risk          9.6          7.13X Average Risk          15.0          11.0                         Examination:   BP 128/82  Pulse 67  Temp(Src) 98.5 F (36.9 C)  Resp 12  Ht 5\' 11"  (1.803 m)  Wt 236 lb (107.049 kg)  BMI 32.93 kg/m2  SpO2 98%  Body mass index is 32.93 kg/(m^2).   No ankle edema  ASSESSMENT/ PLAN::   1. Diabetes type 2  Blood glucose control appears excellent with upper normal A1c and excellent readings at home He is again not checking readings after meals and taking somewhat arbitrary doses of Humalog at mealtimes Also no hypoglycemia.  2. Weight gain: Advised him to start back on exercise regimen and watch caloric intake  3. Excellent control of LDL, needs weight loss and exercise for low HDL of 35  4. Hypertension: Well controlled  5. Iron deficiency anemia: We'll recheck on next visit. Needs complete physical exam also He  will schedule his colonoscopy next year also  [facility] 04/01/2013, 10:46 AM

## 2013-04-01 ENCOUNTER — Encounter: Payer: Self-pay | Admitting: Endocrinology

## 2013-04-02 ENCOUNTER — Other Ambulatory Visit: Payer: Self-pay | Admitting: Endocrinology

## 2013-04-06 ENCOUNTER — Other Ambulatory Visit: Payer: Self-pay | Admitting: Endocrinology

## 2013-04-23 ENCOUNTER — Other Ambulatory Visit: Payer: Self-pay | Admitting: Endocrinology

## 2013-05-04 ENCOUNTER — Other Ambulatory Visit: Payer: Self-pay | Admitting: *Deleted

## 2013-05-04 ENCOUNTER — Telehealth: Payer: Self-pay | Admitting: Endocrinology

## 2013-05-04 MED ORDER — INSULIN GLARGINE 100 UNIT/ML SOLOSTAR PEN: PEN_INJECTOR | SUBCUTANEOUS | Status: DC

## 2013-05-04 NOTE — Telephone Encounter (Signed)
Ok to fill 

## 2013-05-04 NOTE — Telephone Encounter (Signed)
That was e-scribed on 12/18

## 2013-05-04 NOTE — Telephone Encounter (Signed)
That was e-scribed on 12/18 with 1 refill.

## 2013-05-04 NOTE — Telephone Encounter (Signed)
Patient called requesting a 3 month supply for LANTUS SOLOSTAR Stated he is out and was told by [facility] to give [place] a call -please Francee Piccolo

## 2013-05-04 NOTE — Telephone Encounter (Signed)
Called to advise patient.  

## 2013-05-22 ENCOUNTER — Other Ambulatory Visit: Payer: Self-pay | Admitting: *Deleted

## 2013-05-22 MED ORDER — ROSUVASTATIN CALCIUM 20 MG PO TABS: 20.0000 mg | ORAL_TABLET | Freq: Every day | ORAL | Status: DC

## 2013-06-13 ENCOUNTER — Other Ambulatory Visit: Payer: Self-pay | Admitting: Endocrinology

## 2013-09-22 ENCOUNTER — Other Ambulatory Visit (INDEPENDENT_AMBULATORY_CARE_PROVIDER_SITE_OTHER): Payer: Medicare Other

## 2013-09-22 DIAGNOSIS — D649 Anemia, unspecified: Secondary | ICD-10-CM | POA: Diagnosis not present

## 2013-09-22 DIAGNOSIS — E119 Type 2 diabetes mellitus without complications: Secondary | ICD-10-CM | POA: Diagnosis not present

## 2013-09-22 LAB — COMPREHENSIVE METABOLIC PANEL
ALK PHOS: 59 U/L (ref 39–117)
ALT: 12 U/L (ref 0–53)
AST: 17 U/L (ref 0–37)
Albumin: 4 g/dL (ref 3.5–5.2)
BILIRUBIN TOTAL: 0.9 mg/dL (ref 0.2–1.2)
BUN: 19 mg/dL (ref 6–23)
CO2: 28 meq/L (ref 19–32)
Calcium: 9.4 mg/dL (ref 8.4–10.5)
Chloride: 101 mEq/L (ref 96–112)
Creatinine, Ser: 1.6 mg/dL — ABNORMAL HIGH (ref 0.4–1.5)
GFR: 44.91 mL/min — AB (ref >60.00)
GLUCOSE: 108 mg/dL — AB (ref 70–99)
POTASSIUM: 4.2 meq/L (ref 3.5–5.1)
SODIUM: 135 meq/L (ref 135–145)
TOTAL PROTEIN: 7.7 g/dL (ref 6.0–8.3)

## 2013-09-22 LAB — CBC WITH DIFFERENTIAL/PLATELET
BASOS ABS: 0.1 10*3/uL (ref 0.0–0.1)
Basophils Relative: 1.2 % (ref 0.0–3.0)
Eosinophils Absolute: 0.3 10*3/uL (ref 0.0–0.7)
Eosinophils Relative: 5.2 % — ABNORMAL HIGH (ref 0.0–5.0)
HEMATOCRIT: 36.6 % — AB (ref 39.0–52.0)
Hemoglobin: 11.7 g/dL — ABNORMAL LOW (ref 13.0–17.0)
LYMPHS ABS: 1.4 10*3/uL (ref 0.7–4.0)
Lymphocytes Relative: 24.1 % (ref 12.0–46.0)
MCHC: 32 g/dL (ref 30.0–36.0)
MCV: 87.3 fl (ref 78.0–100.0)
MONO ABS: 0.5 10*3/uL (ref 0.1–1.0)
Monocytes Relative: 8.4 % (ref 3.0–12.0)
NEUTROS PCT: 61.1 % (ref 43.0–77.0)
Neutro Abs: 3.5 10*3/uL (ref 1.4–7.7)
Platelets: 275 10*3/uL (ref 150.0–400.0)
RBC: 4.19 Mil/uL — ABNORMAL LOW (ref 4.22–5.81)
RDW: 14.4 % (ref 11.5–15.5)
WBC: 5.7 10*3/uL (ref 4.0–10.5)

## 2013-09-22 LAB — HEMOGLOBIN A1C: Hgb A1c MFr Bld: 6.4 % (ref 4.6–6.5)

## 2013-09-25 ENCOUNTER — Ambulatory Visit: Payer: MEDICARE | Admitting: Endocrinology

## 2013-10-02 ENCOUNTER — Encounter: Payer: Self-pay | Admitting: Endocrinology

## 2013-10-02 ENCOUNTER — Ambulatory Visit (INDEPENDENT_AMBULATORY_CARE_PROVIDER_SITE_OTHER): Payer: Medicare Other | Admitting: Endocrinology

## 2013-10-02 VITALS — BP 122/70 | HR 69 | Temp 98.2°F | Resp 14 | Ht 71.0 in | Wt 225.6 lb

## 2013-10-02 DIAGNOSIS — I10 Essential (primary) hypertension: Secondary | ICD-10-CM

## 2013-10-02 DIAGNOSIS — N189 Chronic kidney disease, unspecified: Secondary | ICD-10-CM

## 2013-10-02 DIAGNOSIS — Z1211 Encounter for screening for malignant neoplasm of colon: Secondary | ICD-10-CM

## 2013-10-02 DIAGNOSIS — E119 Type 2 diabetes mellitus without complications: Secondary | ICD-10-CM

## 2013-10-02 DIAGNOSIS — N289 Disorder of kidney and ureter, unspecified: Secondary | ICD-10-CM | POA: Diagnosis not present

## 2013-10-02 LAB — URINALYSIS, ROUTINE W REFLEX MICROSCOPIC
Bilirubin Urine: NEGATIVE
HGB URINE DIPSTICK: NEGATIVE
Ketones, ur: NEGATIVE
Leukocytes, UA: NEGATIVE
NITRITE: NEGATIVE
Specific Gravity, Urine: 1.02 (ref 1.000–1.030)
TOTAL PROTEIN, URINE-UPE24: NEGATIVE
Urine Glucose: NEGATIVE
Urobilinogen, UA: 0.2 (ref 0.0–1.0)
pH: 5.5 (ref 5.0–8.0)

## 2013-10-02 NOTE — Patient Instructions (Addendum)
Cut Valsartan in 1/2   Please check blood sugars at least half the time about 2 hours after any meal and as directed on waking up. Please bring blood sugar monitor to each visit

## 2013-10-02 NOTE — Progress Notes (Signed)
Patient ID: Eric Grant, male   DOB: Jul 24, 1940, 73 y.o.   MRN: [id number]   Reason for Appointment: Diabetes follow-up   History of Present Illness   Diagnosis: Type 2 DIABETES MELITUS, date of diagnosis:  1988     Previous history: He has been on basal bolus insulin regimen for several years with excellent control He takes mealtime insulin only at breakfast and lunch Also has been taking Actoplusmet with good results for several years and no side effects He was last seen in 12/13 with an A1c of 5.9%.  Recent history: His blood sugars appear to be fairly good from his home readings but is again checking his blood sugars mostly in the mornings  His morning sugars are overall fairly good again.  Has not changed his insulin regimen since his last visit Also of Actoplusmet twice a day without side effects currently     Oral hypoglycemic drugs:   Actoplusmet   twice a day      Side effects from medications: None Insulin regimen:   LANTUS 12 units in the morning. HUMALOG insulin insulin 10 units in the morning and 20 at supper     Proper timing of medications in relation to meals: Yes.          Monitors blood glucose:  0.6 times a day.    Glucometer: One Touch.          Blood Glucose readings from meter download:  FASTING 68-132, midday 127  Hypoglycemia frequency:  rare         Meals: 3 meals per day.          Physical activity: exercise: less recently           Weight control: He has  lost  Wt Readings from Last 3 Encounters:  10/02/13 225 lb 9.6 oz (102.331 kg)  03/31/13 236 lb (107.049 kg)  03/04/09 228 lb (237.62 kg)        Complications:  none   Diabetes labs:  Lab Results  Component Value Date   HGBA1C 6.4 09/22/2013   HGBA1C 6.3 03/27/2013   Lab Results  Component Value Date   MICROALBUR 5.1* 03/27/2013   LDLCALC 60 03/27/2013   CREATININE 1.6* 09/22/2013      Medication List       This list is accurate as of: 10/02/13  8:40 AM.  Always use your most recent  med list.               aspirin 81 MG tablet  Take 81 mg by mouth daily.     B-D ULTRAFINE III SHORT PEN 31G X 8 MM Misc  Generic drug:  Insulin Pen Needle     carvedilol 12.5 MG tablet  Commonly known as:  COREG  Take 12.5 mg by mouth 2 (two) times daily with a meal.     cholecalciferol 400 UNITS Tabs tablet  Commonly known as:  VITAMIN D  Take 400 Units by mouth.     ferrous fumarate-iron polysaccharide complex 162-115.2 MG Caps  Commonly known as:  TANDEM  Take 1 capsule by mouth daily with breakfast. 2 tablets daily     HUMALOG KWIKPEN 100 UNIT/ML KiwkPen  Generic drug:  insulin lispro  INJECT 10 UNITS UNDER THE SKIN AT BREAKFAST AND 20 UNITS AT SUPPER     Hydrocodone-Acetaminophen 7.5-300 MG Tabs     Insulin Glargine 100 UNIT/ML Solostar Pen  Commonly known as:  LANTUS  Inject 10-12 Units into the skin. INJECT  10-12 UNITS IN THE MORNING     ONETOUCH VERIO test strip  Generic drug:  glucose blood  USE TO CHECK BLOOD SUGAR FOUR TIMES A DAY     pioglitazone-metformin 15-850 MG per tablet  Commonly known as:  ACTOPLUS MET  Take 1 tablet by mouth 2 (two) times daily with a meal.     rosuvastatin 20 MG tablet  Commonly known as:  CRESTOR  Take 1 tablet (20 mg total) by mouth daily.     valsartan-hydrochlorothiazide 320-12.5 MG per tablet  Commonly known as:  DIOVAN-HCT  TAKE 1 TABLET DAILY        Allergies: No Known Allergies  Past Medical History  Diagnosis Date  . Seizures 1991  . Urinary tract infection 07/2012    No past surgical history on file.  Family History  Problem Relation Age of Onset  . Hyperlipidemia Mother   . Diabetes Mother   . Hypertension Mother   . Diabetes Father   . Hypertension Father   . Cancer Neg Hx     Social History:  reports that he has never smoked. He has never used smokeless tobacco. His alcohol and drug histories are not on file.  Review of Systems:  Hypertension:  has had long-standing hypertension, currently  taking Diovan HCTZ 320/12.5 and carvedilol.  BP at work checked periodically  Renal insufficiency: His creatinine is higher than usual. Not taking any Advil or Aleve OTC. The symptoms of urinary frequency or discomfort at this time although did have transient symptoms 2 months ago. Does have a previous history of UTI  Lipids: LDL is excellent although HDL is still relatively low with current regimen of Crestor 20 mg  Lab Results  Component Value Date   CHOL 112 03/27/2013   HDL 34.70* 03/27/2013   LDLCALC 60 03/27/2013   TRIG 89.0 03/27/2013   CHOLHDL 3 03/27/2013   Preventive care: Normal Hemocult in 4/13, he thinks he is due for colonoscopy now   Has had mild chronic anemia in the past. However hemoglobin appears to be lower now  has been supplemented with Tandem iron twice a day  for the iron deficiency  Lab Results  Component Value Date   WBC 5.7 09/22/2013   HGB 11.7* 09/22/2013   HCT 36.6* 09/22/2013   MCV 87.3 09/22/2013   PLT 275.0 09/22/2013       LABS:  No visits with results within 1 Week(s) from this visit. Latest known visit with results is:  Appointment on 09/22/2013  Component Date Value Ref Range Status  . Hemoglobin A1C 09/22/2013 6.4  4.6 - 6.5 % Final   Glycemic Control Guidelines for People with Diabetes:Non Diabetic:  <6%Goal of Therapy: <7%Additional Action Suggested:  >8%   . Sodium 09/22/2013 135  135 - 145 mEq/L Final  . Potassium 09/22/2013 4.2  3.5 - 5.1 mEq/L Final  . Chloride 09/22/2013 101  96 - 112 mEq/L Final  . CO2 09/22/2013 28  19 - 32 mEq/L Final  . Glucose, Bld 09/22/2013 108* 70 - 99 mg/dL Final  . BUN 09/22/2013 19  6 - 23 mg/dL Final  . Creatinine, Ser 09/22/2013 1.6* 0.4 - 1.5 mg/dL Final  . Total Bilirubin 09/22/2013 0.9  0.2 - 1.2 mg/dL Final  . Alkaline Phosphatase 09/22/2013 59  39 - 117 U/L Final  . AST 09/22/2013 17  0 - 37 U/L Final  . ALT 09/22/2013 12  0 - 53 U/L Final  . Total Protein 09/22/2013 7.7  6.0 -  8.3 g/dL Final   . Albumin 09/22/2013 4.0  3.5 - 5.2 g/dL Final  . Calcium 09/22/2013 9.4  8.4 - 10.5 mg/dL Final  . GFR 09/22/2013 44.91* >60.00 mL/min Final  . WBC 09/22/2013 5.7  4.0 - 10.5 K/uL Final  . RBC 09/22/2013 4.19* 4.22 - 5.81 Mil/uL Final  . Hemoglobin 09/22/2013 11.7* 13.0 - 17.0 g/dL Final  . HCT 09/22/2013 36.6* 39.0 - 52.0 % Final  . MCV 09/22/2013 87.3  78.0 - 100.0 fl Final  . MCHC 09/22/2013 32.0  30.0 - 36.0 g/dL Final  . RDW 09/22/2013 14.4  11.5 - 15.5 % Final  . Platelets 09/22/2013 275.0  150.0 - 400.0 K/uL Final  . Neutrophils Relative % 09/22/2013 61.1  43.0 - 77.0 % Final  . Lymphocytes Relative 09/22/2013 24.1  12.0 - 46.0 % Final  . Monocytes Relative 09/22/2013 8.4  3.0 - 12.0 % Final  . Eosinophils Relative 09/22/2013 5.2* 0.0 - 5.0 % Final  . Basophils Relative 09/22/2013 1.2  0.0 - 3.0 % Final  . Neutro Abs 09/22/2013 3.5  1.4 - 7.7 K/uL Final  . Lymphs Abs 09/22/2013 1.4  0.7 - 4.0 K/uL Final  . Monocytes Absolute 09/22/2013 0.5  0.1 - 1.0 K/uL Final  . Eosinophils Absolute 09/22/2013 0.3  0.0 - 0.7 K/uL Final  . Basophils Absolute 09/22/2013 0.1  0.0 - 0.1 K/uL Final     Examination:   BP 122/70  Pulse 69  Temp(Src) 98.2 F (36.8 C)  Resp 14  Ht '5\' 11"'  (1.803 m)  Wt 225 lb 9.6 oz (102.331 kg)  BMI 31.48 kg/m2  SpO2 97%  Body mass index is 31.48 kg/(m^2).   No ankle edema  ASSESSMENT/ PLAN:   1. Diabetes type 2  Blood glucose control appears  fairly good with upper normal A1c and  Usually good readings at home He is again not checking readings after meals and taking somewhat arbitrary doses of Humalog at mealtimes He is not exercising regularly but has improved his weight since his last visit For now will continue same regimen, discussed glucose monitoring timings, blood sugar targets, regular exercise, adjusting Humalog based on blood sugars after meals.   2.RENAL insufficiency: No etiology evident for this. Will reduce his Diovan HCTZ to half  tablet and check urine for infection   3. Hyperlipidemia: Continues to be on Crestor with good control   4. Hypertension: Well controlled  5. Iron deficiency anemia: Somewhat worse now for no apparent reason except renal dysfunction We will schedule his followup colonoscopy which is due now,  he wants to have this done with our group  Total visit time including counseling = 25 minutes  Fahim Kats 10/02/2013, 8:40 AM

## 2013-10-03 ENCOUNTER — Other Ambulatory Visit: Payer: Self-pay | Admitting: Endocrinology

## 2013-10-19 ENCOUNTER — Encounter: Payer: Self-pay | Admitting: Gastroenterology

## 2013-10-26 ENCOUNTER — Other Ambulatory Visit: Payer: Self-pay | Admitting: Endocrinology

## 2013-11-08 ENCOUNTER — Other Ambulatory Visit: Payer: Self-pay | Admitting: Endocrinology

## 2013-11-09 ENCOUNTER — Other Ambulatory Visit (INDEPENDENT_AMBULATORY_CARE_PROVIDER_SITE_OTHER): Payer: Medicare Other

## 2013-11-09 DIAGNOSIS — N189 Chronic kidney disease, unspecified: Secondary | ICD-10-CM | POA: Diagnosis not present

## 2013-11-09 LAB — CBC
HEMATOCRIT: 34.5 % — AB (ref 39.0–52.0)
Hemoglobin: 11.1 g/dL — ABNORMAL LOW (ref 13.0–17.0)
MCHC: 32 g/dL (ref 30.0–36.0)
MCV: 87.7 fl (ref 78.0–100.0)
PLATELETS: 272 10*3/uL (ref 150.0–400.0)
RBC: 3.94 Mil/uL — AB (ref 4.22–5.81)
RDW: 14.2 % (ref 11.5–15.5)
WBC: 8.6 10*3/uL (ref 4.0–10.5)

## 2013-11-09 LAB — BASIC METABOLIC PANEL
BUN: 13 mg/dL (ref 6–23)
CHLORIDE: 99 meq/L (ref 96–112)
CO2: 29 mEq/L (ref 19–32)
Calcium: 9.4 mg/dL (ref 8.4–10.5)
Creatinine, Ser: 1.2 mg/dL (ref 0.4–1.5)
GFR: 61.25 mL/min (ref >60.00)
Glucose, Bld: 136 mg/dL — ABNORMAL HIGH (ref 70–99)
Potassium: 4 mEq/L (ref 3.5–5.1)
SODIUM: 136 meq/L (ref 135–145)

## 2013-11-13 ENCOUNTER — Ambulatory Visit (INDEPENDENT_AMBULATORY_CARE_PROVIDER_SITE_OTHER): Payer: Medicare Other | Admitting: Endocrinology

## 2013-11-13 ENCOUNTER — Encounter: Payer: Self-pay | Admitting: Endocrinology

## 2013-11-13 VITALS — BP 133/72 | HR 63 | Temp 98.1°F | Resp 16 | Ht 71.0 in | Wt 228.2 lb

## 2013-11-13 DIAGNOSIS — M109 Gout, unspecified: Secondary | ICD-10-CM

## 2013-11-13 DIAGNOSIS — N289 Disorder of kidney and ureter, unspecified: Secondary | ICD-10-CM | POA: Diagnosis not present

## 2013-11-13 DIAGNOSIS — E119 Type 2 diabetes mellitus without complications: Secondary | ICD-10-CM | POA: Diagnosis not present

## 2013-11-13 DIAGNOSIS — D649 Anemia, unspecified: Secondary | ICD-10-CM

## 2013-11-13 MED ORDER — COLCHICINE 0.6 MG PO TABS: 0.6000 mg | ORAL_TABLET | Freq: Every day | ORAL | Status: DC

## 2013-11-13 NOTE — Progress Notes (Signed)
Patient ID: Eric Grant, male   DOB: 06-07-1940, 73 y.o.   MRN: [id number]   Reason for Appointment:  Foot pain and other issues  History of Present Illness   Problem 1: He is here in short-term followup of his renal dysfunction as his creatinine had gone up to 1.6 for no obvious reasons Did not have any decrease in blood pressure but empirically was told to take half tablet of Diovan HCT Creatinine is back to normal now and blood pressure is not any higher  Problem 2: Acute gout: He is complaining of pain in his left foot for the last week. This is somewhat migrating with initial episode in the second toe and now is having pain on walking with some swelling on the big toe. He took some Advil over-the-counter after talking to the pharmacist but did not report pain until today He has previously had mild hyperuricemia and remote history of gout in 2006. Not on any allopurinol   Problem 3:  Type 2 DIABETES MELITUS, date of diagnosis:  1988    Previous history: He has been on basal bolus insulin regimen for several years with excellent control He takes mealtime insulin only at breakfast and lunch Also has been taking Actoplusmet with good results for several years and no side effects Recent history: His blood sugars appear to be nearly normal from his home blood sugar download and he has done a few more readings in the afternoon but minimally after her evening meal Morning sugars are averaging below 100 but only once had a low sugar His weight has gone back up slightly A1c is usually upper normal Taking insulin plus Actoplusmet twice a day without side effects      Oral hypoglycemic drugs:   Actoplusmet   twice a day      Side effects from medications: None Insulin regimen:   LANTUS 12 units in the morning. HUMALOG insulin insulin 10 units in the morning and 20 at supper     Proper timing of medications in relation to meals: Yes.          Monitors blood glucose:  0.6 times a day.     Glucometer: One Touch.          Blood Glucose readings from meter download:  PREMEAL Breakfast Lunch Dinner Bedtime Overall  Glucose range:  57-115   91-120   136   147, 104    Mean/median:  83      107    Hypoglycemia frequency:  rare         Meals: 3 meals per day.          Physical activity: exercise: Irregular          Weight control: He has gained 3 pounds    Wt Readings from Last 3 Encounters:  11/13/13 228 lb 3.2 oz (103.511 kg)  10/02/13 225 lb 9.6 oz (102.331 kg)  03/31/13 236 lb (053.976 kg)        Complications:  none   Diabetes labs:  Lab Results  Component Value Date   HGBA1C 6.4 09/22/2013   HGBA1C 6.3 03/27/2013   Lab Results  Component Value Date   MICROALBUR 5.1* 03/27/2013   LDLCALC 60 03/27/2013   CREATININE 1.2 11/09/2013      Medication List       This list is accurate as of: 11/13/13  8:32 AM.  Always use your most recent med list.  aspirin 81 MG tablet  Take 81 mg by mouth daily.     B-D ULTRAFINE III SHORT PEN 31G X 8 MM Misc  Generic drug:  Insulin Pen Needle  USE THREE TIMES A DAY     carvedilol 12.5 MG tablet  Commonly known as:  COREG  Take 12.5 mg by mouth 2 (two) times daily with a meal.     cholecalciferol 400 UNITS Tabs tablet  Commonly known as:  VITAMIN D  Take 400 Units by mouth.     CRESTOR 20 MG tablet  Generic drug:  rosuvastatin  TAKE 1 TABLET DAILY     ferrous fumarate-iron polysaccharide complex 162-115.2 MG Caps  Commonly known as:  TANDEM  Take 1 capsule by mouth daily with breakfast. 2 tablets daily     HUMALOG KWIKPEN 100 UNIT/ML KiwkPen  Generic drug:  insulin lispro  INJECT 10 UNITS UNDER THE SKIN AT BREAKFAST AND 20 UNITS AT SUPPER     Hydrocodone-Acetaminophen 7.5-300 MG Tabs     Insulin Glargine 100 UNIT/ML Solostar Pen  Commonly known as:  LANTUS  Inject 10-12 Units into the skin. INJECT 10-12 UNITS IN THE MORNING     ONETOUCH VERIO test strip  Generic drug:  glucose blood  USE  TO CHECK BLOOD SUGAR FOUR TIMES A DAY     pioglitazone-metformin 15-850 MG per tablet  Commonly known as:  ACTOPLUS MET  Take 1 tablet by mouth 2 (two) times daily with a meal.     valsartan-hydrochlorothiazide 320-12.5 MG per tablet  Commonly known as:  DIOVAN-HCT  TAKE 1/2  TABLET DAILY        Allergies: No Known Allergies  Past Medical History  Diagnosis Date  . Seizures 1991  . Urinary tract infection 07/2012    No past surgical history on file.  Family History  Problem Relation Age of Onset  . Hyperlipidemia Mother   . Diabetes Mother   . Hypertension Mother   . Diabetes Father   . Hypertension Father   . Cancer Neg Hx     Social History:  reports that he has never smoked. He has never used smokeless tobacco. His alcohol and drug histories are not on file.  Review of Systems:  Hypertension:  has had long-standing hypertension, currently taking half of  Diovan HCTZ 320/12.5 and carvedilol.  BP at work checked periodically 120s /70  Lipids: LDL is excellent although HDL is relatively low with current regimen of Crestor 20 mg  Lab Results  Component Value Date   CHOL 112 03/27/2013   HDL 34.70* 03/27/2013   LDLCALC 60 03/27/2013   TRIG 89.0 03/27/2013   CHOLHDL 3 03/27/2013     Has had mild chronic anemia. However hemoglobin appears to be lower nowdespite compliance been good with   OTC Tandem iron twice a day  for the iron deficiency He is scheduled for a colonoscopy   Lab Results  Component Value Date   WBC 8.6 11/09/2013   HGB 11.1* 11/09/2013   HCT 34.5* 11/09/2013   MCV 87.7 11/09/2013   PLT 272.0 11/09/2013       LABS:  Appointment on 11/09/2013  Component Date Value Ref Range Status  . Sodium 11/09/2013 136  135 - 145 mEq/L Final  . Potassium 11/09/2013 4.0  3.5 - 5.1 mEq/L Final  . Chloride 11/09/2013 99  96 - 112 mEq/L Final  . CO2 11/09/2013 29  19 - 32 mEq/L Final  . Glucose, Bld 11/09/2013 136* 70 - 99  mg/dL Final  . BUN 11/09/2013 13  6 -  23 mg/dL Final  . Creatinine, Ser 11/09/2013 1.2  0.4 - 1.5 mg/dL Final  . Calcium 11/09/2013 9.4  8.4 - 10.5 mg/dL Final  . GFR 11/09/2013 61.25  >60.00 mL/min Final  . WBC 11/09/2013 8.6  4.0 - 10.5 K/uL Final  . RBC 11/09/2013 3.94* 4.22 - 5.81 Mil/uL Final  . Platelets 11/09/2013 272.0  150.0 - 400.0 K/uL Final  . Hemoglobin 11/09/2013 11.1* 13.0 - 17.0 g/dL Final  . HCT 11/09/2013 34.5* 39.0 - 52.0 % Final  . MCV 11/09/2013 87.7  78.0 - 100.0 fl Final  . MCHC 11/09/2013 32.0  30.0 - 36.0 g/dL Final  . RDW 11/09/2013 14.2  11.5 - 15.5 % Final     Examination:   BP 133/72  Pulse 63  Temp(Src) 98.1 F (36.7 C)  Resp 16  Ht '5\' 11"'  (1.803 m)  Wt 228 lb 3.2 oz (103.511 kg)  BMI 31.84 kg/m2  SpO2 99%  Body mass index is 31.84 kg/(m^2).   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 Discussed how this works, dosage regimen and he will call if he has any recurrence Avoid nonsteroidal drugs because of his history of renal insufficiency and anemia. Will check his uric acid on the next visit, previously has been minimally increased and he has had only infrequent episodes of gout not requiring prophylactic treatment   Diabetes type 2  Blood glucose control appears  fairly good with  usually normal A1c and  good readings at home He is again not checking readings after meals especially after supper  He is not exercising regularly and he resume when able to, needs to keep his weight down and discussed blood sugar targets and to call if he has any hypoglycemia    RENAL insufficiency:  resolved with reducing  his Diovan HCTZ to half tablet  day and will continue    Hyperlipidemia:  Has been treated with Crestor with good control, tends to have low HDL     Hypertension: Well controlled    Iron deficiency anemia: Somewhat worse now for no apparent reason, Possibly related to taking Advil recently for his gout  He will have his colonoscopy done next month   Mykle Pascua 11/13/2013, 8:32 AM

## 2013-11-15 NOTE — Patient Instructions (Signed)
More blood sugars after supper

## 2013-11-19 ENCOUNTER — Other Ambulatory Visit: Payer: Self-pay | Admitting: Endocrinology

## 2013-11-20 ENCOUNTER — Telehealth: Payer: Self-pay | Admitting: Endocrinology

## 2013-11-20 ENCOUNTER — Other Ambulatory Visit: Payer: Self-pay | Admitting: *Deleted

## 2013-11-20 MED ORDER — CIPROFLOXACIN HCL 500 MG PO TABS: ORAL_TABLET | ORAL | Status: DC

## 2013-11-20 NOTE — Telephone Encounter (Signed)
Please read note below and advise.  

## 2013-11-20 NOTE — Telephone Encounter (Signed)
Pt believes he has a UTI he has frequent urination with urgency with burning pain

## 2013-11-20 NOTE — Telephone Encounter (Signed)
Cipro 500 mg twice a day, 10 tablets no refills

## 2013-12-11 ENCOUNTER — Other Ambulatory Visit: Payer: Self-pay | Admitting: Endocrinology

## 2013-12-25 ENCOUNTER — Encounter: Payer: MEDICARE | Admitting: Gastroenterology

## 2013-12-31 ENCOUNTER — Encounter: Payer: MEDICARE | Admitting: Gastroenterology

## 2014-01-06 ENCOUNTER — Other Ambulatory Visit: Payer: Self-pay | Admitting: Endocrinology

## 2014-01-24 ENCOUNTER — Other Ambulatory Visit: Payer: Self-pay | Admitting: Endocrinology

## 2014-02-19 ENCOUNTER — Encounter: Payer: MEDICARE | Admitting: Endocrinology

## 2014-02-19 ENCOUNTER — Other Ambulatory Visit: Payer: MEDICARE

## 2014-03-29 ENCOUNTER — Encounter: Payer: Self-pay | Admitting: Endocrinology

## 2014-03-29 ENCOUNTER — Ambulatory Visit (INDEPENDENT_AMBULATORY_CARE_PROVIDER_SITE_OTHER): Payer: Medicare Other | Admitting: Endocrinology

## 2014-03-29 ENCOUNTER — Other Ambulatory Visit (INDEPENDENT_AMBULATORY_CARE_PROVIDER_SITE_OTHER): Payer: Medicare Other

## 2014-03-29 VITALS — BP 132/75 | HR 64 | Temp 98.3°F | Resp 16 | Ht 70.0 in | Wt 223.4 lb

## 2014-03-29 DIAGNOSIS — E119 Type 2 diabetes mellitus without complications: Secondary | ICD-10-CM

## 2014-03-29 DIAGNOSIS — M109 Gout, unspecified: Secondary | ICD-10-CM

## 2014-03-29 DIAGNOSIS — Z0000 Encounter for general adult medical examination without abnormal findings: Secondary | ICD-10-CM | POA: Diagnosis not present

## 2014-03-29 DIAGNOSIS — I10 Essential (primary) hypertension: Secondary | ICD-10-CM | POA: Diagnosis not present

## 2014-03-29 DIAGNOSIS — D649 Anemia, unspecified: Secondary | ICD-10-CM | POA: Diagnosis not present

## 2014-03-29 DIAGNOSIS — M1000 Idiopathic gout, unspecified site: Secondary | ICD-10-CM | POA: Diagnosis not present

## 2014-03-29 DIAGNOSIS — Z23 Encounter for immunization: Secondary | ICD-10-CM

## 2014-03-29 LAB — URINALYSIS, ROUTINE W REFLEX MICROSCOPIC
Bilirubin Urine: NEGATIVE
Hgb urine dipstick: NEGATIVE
Ketones, ur: NEGATIVE
Leukocytes, UA: NEGATIVE
NITRITE: NEGATIVE
PH: 6 (ref 5.0–8.0)
RBC / HPF: NONE SEEN (ref 0–?)
SPECIFIC GRAVITY, URINE: 1.02 (ref 1.000–1.030)
Total Protein, Urine: NEGATIVE
UROBILINOGEN UA: 0.2 (ref 0.0–1.0)
Urine Glucose: NEGATIVE

## 2014-03-29 LAB — CBC
HEMATOCRIT: 41.4 % (ref 39.0–52.0)
Hemoglobin: 12.7 g/dL — ABNORMAL LOW (ref 13.0–17.0)
MCHC: 30.7 g/dL (ref 30.0–36.0)
MCV: 88.5 fl (ref 78.0–100.0)
PLATELETS: 260 10*3/uL (ref 150.0–400.0)
RBC: 4.68 Mil/uL (ref 4.22–5.81)
RDW: 14.4 % (ref 11.5–15.5)
WBC: 6 10*3/uL (ref 4.0–10.5)

## 2014-03-29 LAB — MICROALBUMIN / CREATININE URINE RATIO
Creatinine,U: 134.3 mg/dL
Microalb Creat Ratio: 4.2 mg/g (ref 0.0–30.0)
Microalb, Ur: 5.7 mg/dL — ABNORMAL HIGH (ref 0.0–1.9)

## 2014-03-29 LAB — URIC ACID: Uric Acid, Serum: 7.4 mg/dL (ref 4.0–7.8)

## 2014-03-29 LAB — IBC PANEL
Iron: 61 ug/dL (ref 42–165)
SATURATION RATIOS: 16.4 % — AB (ref 20.0–50.0)
Transferrin: 266.2 mg/dL (ref 212.0–360.0)

## 2014-03-29 LAB — HEMOGLOBIN A1C: Hgb A1c MFr Bld: 5.6 % (ref 4.6–6.5)

## 2014-03-29 NOTE — Progress Notes (Deleted)
Patient ID: Ruslan Mccabe, male   DOB: [birth year], 73 y.o.   MRN: [id number]   Reason for Appointment: Diabetes follow-up   History of Present Illness   Diagnosis: Type 2 DIABETES MELITUS, date of diagnosis:  1988     Previous history: He has been on basal bolus insulin regimen for several years with excellent control He takes mealtime insulin only at breakfast and lunch Also has been taking Actoplusmet with good results for several years and no side effects He was last seen in 12/13 with an A1c of 5.9%.  Recent history: He is again checking his blood sugars mostly in the mornings on waking up in no readings after meals as discussed on every visit His morning sugars are overall fairly good and averaging below 100 but will have sporadic high readings which he thinks are from eating late at night or eating more. Has not changed his insulin regimen since his last visit     Oral hypoglycemic drugs:   Actoplusmet   twice a day      Side effects from medications: None Insulin regimen:   LANTUS 18 units in the morning. HUMALOG insulin insulin 6 units in the morning and 15 at supper     Proper timing of medications in relation to meals: Yes.          Monitors blood glucose: Once a day.    Glucometer: One Touch.          Blood Glucose readings from meter download: readings before breakfast: Median 92 with a range 73-163. No readings after meals  Hypoglycemia frequency:  rare         Meals: 3 meals per day.          Physical activity: exercise: less recently           Weight control: He has gained 9 pounds over the last year  Wt Readings from Last 3 Encounters:  03/29/14 223 lb 6.4 oz (101.334 kg)  11/13/13 228 lb 3.2 oz (103.511 kg)  10/02/13 225 lb 9.6 oz (102.331 kg)          Complications:  none   Diabetes labs:  Lab Results  Component Value Date   HGBA1C 6.4 09/22/2013   HGBA1C 6.3 03/27/2013   Lab Results  Component Value Date   MICROALBUR 5.1* 03/27/2013   LDLCALC 60  03/27/2013   CREATININE 1.2 11/09/2013      Medication List       This list is accurate as of: 03/29/14 10:12 AM.  Always use your most recent med list.               aspirin 81 MG tablet  Take 81 mg by mouth daily.     B-D ULTRAFINE III SHORT PEN 31G X 8 MM Misc  Generic drug:  Insulin Pen Needle  USE THREE TIMES A DAY     carvedilol 12.5 MG tablet  Commonly known as:  COREG  Take 12.5 mg by mouth 2 (two) times daily with a meal.     cholecalciferol 400 UNITS Tabs tablet  Commonly known as:  VITAMIN D  Take 400 Units by mouth.     ciprofloxacin 500 MG tablet  Commonly known as:  CIPRO  Take 1 tablet twice daily     colchicine 0.6 MG tablet  Commonly known as:  COLCRYS  Take 1 tablet (0.6 mg total) by mouth daily.     CRESTOR 20 MG tablet  Generic drug:  rosuvastatin  TAKE 1 TABLET DAILY     ferrous fumarate-iron polysaccharide complex 162-115.2 MG Caps  Commonly known as:  TANDEM  Take 1 capsule by mouth daily with breakfast. 2 tablets daily     HUMALOG KWIKPEN 100 UNIT/ML KiwkPen  Generic drug:  insulin lispro  INJECT 10 UNITS UNDER THE SKIN AT BREAKFAST AND 20 UNITS AT SUPPER     Insulin Glargine 100 UNIT/ML Solostar Pen  Commonly known as:  LANTUS  Inject 10-12 Units into the skin. INJECT 10-12 UNITS IN THE MORNING     LANTUS SOLOSTAR 100 UNIT/ML Solostar Pen  Generic drug:  Insulin Glargine  INJECT 18 UNITS IN THE MORNING     ONETOUCH VERIO test strip  Generic drug:  glucose blood  USE TO CHECK BLOOD SUGAR FOUR TIMES A DAY     pioglitazone-metformin 15-850 MG per tablet  Commonly known as:  ACTOPLUS MET  Take 1 tablet by mouth 2 (two) times daily with a meal.     valsartan-hydrochlorothiazide 320-12.5 MG per tablet  Commonly known as:  DIOVAN-HCT  TAKE 1/2  TABLET DAILY        Allergies: No Known Allergies  Past Medical History  Diagnosis Date  . Seizures 1991  . Urinary tract infection 07/2012    No past surgical history on  file.  Family History  Problem Relation Age of Onset  . Hyperlipidemia Mother   . Diabetes Mother   . Hypertension Mother   . Diabetes Father   . Hypertension Father   . Cancer Neg Hx     Social History:  reports that he has never smoked. He has never used smokeless tobacco. His alcohol and drug histories are not on file.  Review of Systems:  Hypertension:  has had long-standing hypertension, currently taking Diovan HCTZ and carvedilol.  BP at work recently 117/80  Lipids: LDL is excellent although HDL is still relatively low with current regimen of Crestor 20 mg  Preventive care: Normal Hemocult in 4/13, he thinks he is due for colonoscopy next year. Has had mild chronic anemia in the past, last hemoglobin 12.5; has been supplemented with Tandem iron for the iron deficiency    LABS:  No visits with results within 1 Week(s) from this visit. Latest known visit with results is:  Appointment on 11/09/2013  Component Date Value Ref Range Status  . Sodium 11/09/2013 136  135 - 145 mEq/L Final  . Potassium 11/09/2013 4.0  3.5 - 5.1 mEq/L Final  . Chloride 11/09/2013 99  96 - 112 mEq/L Final  . CO2 11/09/2013 29  19 - 32 mEq/L Final  . Glucose, Bld 11/09/2013 136* 70 - 99 mg/dL Final  . BUN 11/09/2013 13  6 - 23 mg/dL Final  . Creatinine, Ser 11/09/2013 1.2  0.4 - 1.5 mg/dL Final  . Calcium 11/09/2013 9.4  8.4 - 10.5 mg/dL Final  . GFR 11/09/2013 61.25  >60.00 mL/min Final  . WBC 11/09/2013 8.6  4.0 - 10.5 K/uL Final  . RBC 11/09/2013 3.94* 4.22 - 5.81 Mil/uL Final  . Platelets 11/09/2013 272.0  150.0 - 400.0 K/uL Final  . Hemoglobin 11/09/2013 11.1* 13.0 - 17.0 g/dL Final  . HCT 11/09/2013 34.5* 39.0 - 52.0 % Final  . MCV 11/09/2013 87.7  78.0 - 100.0 fl Final  . MCHC 11/09/2013 32.0  30.0 - 36.0 g/dL Final  . RDW 11/09/2013 14.2  11.5 - 15.5 % Final     Examination:   BP 132/75 mmHg  Pulse 64  Temp(Src) 98.3 F (36.8 C)  Resp 16  Ht '5\' 10"'  (1.778 m)  Wt 223 lb 6.4  oz (101.334 kg)  BMI 32.05 kg/m2  SpO2 98%  Body mass index is 32.05 kg/(m^2).   No ankle edema  ASSESSMENT/ PLAN::   1. Diabetes type 2  Blood glucose control appears excellent with upper normal A1c and excellent readings at home He is again not checking readings after meals and taking somewhat arbitrary doses of Humalog at mealtimes Also no hypoglycemia.  2. Weight gain: Advised him to start back on exercise regimen and watch caloric intake  3. Excellent control of LDL, needs weight loss and exercise for low HDL of 35  4. Hypertension: Well controlled  5. Iron deficiency anemia: We'll recheck on next visit. Needs complete physical exam also He  will schedule his colonoscopy next year also  Mary Secord 03/29/2014, 10:12 AM

## 2014-03-29 NOTE — Patient Instructions (Addendum)
Care plan:  Follow low fat low carbohydrate diet protein at each meal  Colonoscopy: schedule in January  Annual eye exams recommended  Recommend regular exercise , 30 minutes of aerobic activity 4-5 times a week  Prevnar pneumonia vaccine to be given today with Flu shot  Please formulate living will, form available online

## 2014-03-29 NOTE — Progress Notes (Signed)
Patient ID: Eric Grant, male   DOB: 10/18/1940, 73 y.o.   MRN: [id number]   Subjective:    Patient is being seen today for Medicare annual wellness visit and review of chronic problems.     Risk factors: Advancing age, diabetes    Roster of Physicians Providing Medical Care to Patient:  See "care team section"  Activities of Daily Living:  In the present state of health, the patient has no difficulty performing the following activities:  Preparing food and eating, Bathing, Getting dressed and taking care of daily personal  needs Patient is able to ambulate without feeling unsteady In the past year the patient has not fallen or had a near fall    Safety: Has smoke detector and wears seat belts.    Diet and Exercise  Current exercise habits: Walking 2-3 days per week Dietary issues discussed: heart healthy diet   Depression Screen:  Results available in the depression screening section  Advance directives:  Details as in advance directives section, currently has none  The following portions of the patient's history were reviewed and updated as appropriate: allergies, current medications, past family history, past medical history, past social history, past surgical history and problem list.   Preventive Care:  Last hemoccults:          ?  2014   LDL/HDL:                              60/35   Colonoscopy/sigmoidoscopy: 6/09  Flu vaccine yes  PSA: Not done  Pneumovax: 2000  Zostavax:  4/10  Aspirin: daily        Medication List       This list is accurate as of: 03/29/14 10:16 AM.  Always use your most recent med list.               aspirin 81 MG tablet  Take 81 mg by mouth daily.     B-D ULTRAFINE III SHORT PEN 31G X 8 MM Misc  Generic drug:  Insulin Pen Needle  USE THREE TIMES A DAY     carvedilol 12.5 MG tablet  Commonly known as:  COREG  Take 12.5 mg by mouth 2 (two) times daily with a meal.     cholecalciferol 400 UNITS Tabs tablet  Commonly known as:   VITAMIN D  Take 400 Units by mouth.     ciprofloxacin 500 MG tablet  Commonly known as:  CIPRO  Take 1 tablet twice daily     colchicine 0.6 MG tablet  Commonly known as:  COLCRYS  Take 1 tablet (0.6 mg total) by mouth daily.     CRESTOR 20 MG tablet  Generic drug:  rosuvastatin  TAKE 1 TABLET DAILY     ferrous fumarate-iron polysaccharide complex 162-115.2 MG Caps  Commonly known as:  TANDEM  Take 1 capsule by mouth daily with breakfast. 2 tablets daily     HUMALOG KWIKPEN 100 UNIT/ML KiwkPen  Generic drug:  insulin lispro  INJECT 10 UNITS UNDER THE SKIN AT BREAKFAST AND 20 UNITS AT SUPPER     Insulin Glargine 100 UNIT/ML Solostar Pen  Commonly known as:  LANTUS  Inject 10-12 Units into the skin. INJECT 10-12 UNITS IN THE MORNING     LANTUS SOLOSTAR 100 UNIT/ML Solostar Pen  Generic drug:  Insulin Glargine  INJECT 18 UNITS IN THE MORNING     ONETOUCH VERIO test strip  Generic drug:  glucose blood  USE TO CHECK BLOOD SUGAR FOUR TIMES A DAY     pioglitazone-metformin 15-850 MG per tablet  Commonly known as:  ACTOPLUS MET  Take 1 tablet by mouth 2 (two) times daily with a meal.     valsartan-hydrochlorothiazide 320-12.5 MG per tablet  Commonly known as:  DIOVAN-HCT  TAKE 1/2  TABLET DAILY        Allergies: No Known Allergies  Past Medical History  Diagnosis Date  . Seizures 1991  . Urinary tract infection 07/2012    No past surgical history on file.  Family History  Problem Relation Age of Onset  . Hyperlipidemia Mother   . Diabetes Mother   . Hypertension Mother   . Diabetes Father   . Hypertension Father   . Cancer Neg Hx     Social History:  reports that he has never smoked. He has never used smokeless tobacco. His alcohol and drug histories are not on file.   Review of Systems:    Has annual eye exams, last 4 mths ago Denies hearing loss, and visual loss Does not have any changes in appetite  No height loss  Hypertension: Long-standing and  well controlled, Bp at home averaging 130/80  Diabetes: Taking 18 Lantus at supper and Humalog twice a day, glucose stable  Lost weight with dental extractions recently  Wt Readings from Last 3 Encounters:  03/29/14 223 lb 6.4 oz (101.334 kg)  11/13/13 228 lb 3.2 oz (103.511 kg)  10/02/13 225 lb 9.6 oz (102.331 kg)   Colon polyps on last exam done by Dr Collene Mares and is due for another exam  History of iron deficiency anemia: To have labs checked today  Hyperlipidemia treated with Crestor  Lab Results  Component Value Date   CHOL 112 03/27/2013   HDL 34.70* 03/27/2013   LDLCALC 60 03/27/2013   TRIG 89.0 03/27/2013   CHOLHDL 3 03/27/2013      Objective:    BP 132/75 mmHg  Pulse 64  Temp(Src) 98.3 F (36.8 C)  Resp 16  Ht _0  (1.778 m)  Wt 223 lb 6.4 oz (101.334 kg)  BMI 32.05 kg/m2  SpO2 98%  Vision:  Normal,  Hearing: grossly normal Body mass index:  See vitals Neurological/balance: He easily and quickly performs "get-up-and-go" from a sitting position Cognitive Impairment Assessment: cognition, memory and judgment appear normal.    Has excellent recall.  can easily read a sentence.  alert and oriented x 3   Assessment:   Medicare wellness evaluation done   Preventive parameters reviewed Labs for chronic conditions ordered Needs Prevnar today    Plan:   During the course of the visit the patient was educated and counseled about appropriate screening and preventive services including:       Fall prevention   Need for living will Bone densitometry screening not indicated  Diabetes management to be assessed by A1c Nutrition counseling done Lipid assessment to be done Colorectal screening: He will schedule colonoscopy for early next year with his gastroenterologist Regular eye and dental exams to be continued as previously scheduled   Vaccines / LABS Zostavax / Pneumococcal Vaccine up-to-date Prevnar to be done today  EKG done today    Patient  Instructions (the written plan) was given to the patient.   [facility] 03/29/2014   Labs as follows  Appointment on 03/29/2014  Component Date Value Ref Range Status  . Hgb A1c MFr Bld 03/29/2014 5.6  4.6 - 6.5 % Final  Glycemic Control Guidelines for People with Diabetes:Non Diabetic:  <6%Goal of Therapy: <7%Additional Action Suggested:  >8%   . Sodium 03/29/2014 141  135 - 145 mEq/L Final  . Potassium 03/29/2014 4.8  3.5 - 5.1 mEq/L Final  . Chloride 03/29/2014 105  96 - 112 mEq/L Final  . CO2 03/29/2014 27  19 - 32 mEq/L Final  . Glucose, Bld 03/29/2014 67* 70 - 99 mg/dL Final  . BUN 03/29/2014 18  6 - 23 mg/dL Final  . Creatinine, Ser 03/29/2014 1.4  0.4 - 1.5 mg/dL Final  . Total Bilirubin 03/29/2014 0.9  0.2 - 1.2 mg/dL Final  . Alkaline Phosphatase 03/29/2014 67  39 - 117 U/L Final  . AST 03/29/2014 17  0 - 37 U/L Final  . ALT 03/29/2014 14  0 - 53 U/L Final  . Total Protein 03/29/2014 7.9  6.0 - 8.3 g/dL Final  . Albumin 03/29/2014 3.9  3.5 - 5.2 g/dL Final  . Calcium 03/29/2014 9.7  8.4 - 10.5 mg/dL Final  . GFR 03/29/2014 52.69* >60.00 mL/min Final  . Color, Urine 03/29/2014 YELLOW  Yellow;Lt. Yellow Final  . APPearance 03/29/2014 CLEAR  Clear Final  . Specific Gravity, Urine 03/29/2014 1.020  1.000-1.030 Final  . pH 03/29/2014 6.0  5.0 - 8.0 Final  . Total Protein, Urine 03/29/2014 NEGATIVE  Negative Final  . Urine Glucose 03/29/2014 NEGATIVE  Negative Final  . Ketones, ur 03/29/2014 NEGATIVE  Negative Final  . Bilirubin Urine 03/29/2014 NEGATIVE  Negative Final  . Hgb urine dipstick 03/29/2014 NEGATIVE  Negative Final  . Urobilinogen, UA 03/29/2014 0.2  0.0 - 1.0 Final  . Leukocytes, UA 03/29/2014 NEGATIVE  Negative Final  . Nitrite 03/29/2014 NEGATIVE  Negative Final  . WBC, UA 03/29/2014 0-2/hpf  0-2/hpf Final  . RBC / HPF 03/29/2014 none seen  0-2/hpf Final  . Squamous Epithelial / LPF 03/29/2014 Rare(0-4/hpf)  Rare(0-4/hpf) Final  . Microalb, Ur 03/29/2014  5.7* 0.0 - 1.9 mg/dL Final  . Creatinine,U 03/29/2014 134.3   Final  . Microalb Creat Ratio 03/29/2014 4.2  0.0 - 30.0 mg/g Final  . WBC 03/29/2014 6.0  4.0 - 10.5 K/uL Final  . RBC 03/29/2014 4.68  4.22 - 5.81 Mil/uL Final  . Platelets 03/29/2014 260.0  150.0 - 400.0 K/uL Final  . Hemoglobin 03/29/2014 12.7* 13.0 - 17.0 g/dL Final  . HCT 03/29/2014 41.4  39.0 - 52.0 % Final  . MCV 03/29/2014 88.5  78.0 - 100.0 fl Final  . MCHC 03/29/2014 30.7  30.0 - 36.0 g/dL Final  . RDW 03/29/2014 14.4  11.5 - 15.5 % Final  . Iron 03/29/2014 61  42 - 165 ug/dL Final  . Transferrin 03/29/2014 266.2  212.0 - 360.0 mg/dL Final  . Saturation Ratios 03/29/2014 16.4* 20.0 - 50.0 % Final  . Uric Acid, Serum 03/29/2014 7.4  4.0 - 7.8 mg/dL Final

## 2014-03-30 ENCOUNTER — Other Ambulatory Visit: Payer: MEDICARE

## 2014-03-30 ENCOUNTER — Other Ambulatory Visit (INDEPENDENT_AMBULATORY_CARE_PROVIDER_SITE_OTHER): Payer: Medicare Other

## 2014-03-30 DIAGNOSIS — E785 Hyperlipidemia, unspecified: Secondary | ICD-10-CM | POA: Diagnosis not present

## 2014-03-30 LAB — COMPREHENSIVE METABOLIC PANEL
ALT: 14 U/L (ref 0–53)
AST: 17 U/L (ref 0–37)
Albumin: 3.9 g/dL (ref 3.5–5.2)
Alkaline Phosphatase: 67 U/L (ref 39–117)
BILIRUBIN TOTAL: 0.9 mg/dL (ref 0.2–1.2)
BUN: 18 mg/dL (ref 6–23)
CALCIUM: 9.7 mg/dL (ref 8.4–10.5)
CHLORIDE: 105 meq/L (ref 96–112)
CO2: 27 meq/L (ref 19–32)
CREATININE: 1.4 mg/dL (ref 0.4–1.5)
GFR: 52.69 mL/min — AB (ref >60.00)
Glucose, Bld: 67 mg/dL — ABNORMAL LOW (ref 70–99)
Potassium: 4.8 mEq/L (ref 3.5–5.1)
Sodium: 141 mEq/L (ref 135–145)
Total Protein: 7.9 g/dL (ref 6.0–8.3)

## 2014-03-30 LAB — LIPID PANEL
Cholesterol: 117 mg/dL (ref 0–200)
HDL: 40.3 mg/dL (ref >39.00)
LDL CALC: 63 mg/dL (ref 0–99)
NonHDL: 76.7
TRIGLYCERIDES: 71 mg/dL (ref 0.0–149.0)
Total CHOL/HDL Ratio: 3
VLDL: 14.2 mg/dL (ref 0.0–40.0)

## 2014-03-30 NOTE — Progress Notes (Signed)
Quick Note:  Please let patient know that the A1c is excellent at 5.6, anemia better: Continue iron. Add lipid panel ______

## 2014-04-01 ENCOUNTER — Other Ambulatory Visit: Payer: Self-pay | Admitting: Endocrinology

## 2014-04-05 ENCOUNTER — Other Ambulatory Visit: Payer: Self-pay | Admitting: *Deleted

## 2014-04-05 MED ORDER — CARVEDILOL 12.5 MG PO TABS: 12.5000 mg | ORAL_TABLET | Freq: Two times a day (BID) | ORAL | Status: DC

## 2014-04-12 ENCOUNTER — Other Ambulatory Visit: Payer: Self-pay | Admitting: Endocrinology

## 2014-06-29 ENCOUNTER — Other Ambulatory Visit: Payer: MEDICARE

## 2014-07-02 ENCOUNTER — Ambulatory Visit: Payer: MEDICARE | Admitting: Endocrinology

## 2014-07-29 ENCOUNTER — Other Ambulatory Visit (INDEPENDENT_AMBULATORY_CARE_PROVIDER_SITE_OTHER): Payer: Medicare HMO

## 2014-07-29 ENCOUNTER — Other Ambulatory Visit: Payer: Self-pay | Admitting: *Deleted

## 2014-07-29 DIAGNOSIS — E119 Type 2 diabetes mellitus without complications: Secondary | ICD-10-CM

## 2014-07-29 LAB — LIPID PANEL
CHOLESTEROL: 111 mg/dL (ref 0–200)
HDL: 44.4 mg/dL (ref >39.00)
LDL CALC: 58 mg/dL (ref 0–99)
NonHDL: 66.6
TRIGLYCERIDES: 43 mg/dL (ref 0.0–149.0)
Total CHOL/HDL Ratio: 3
VLDL: 8.6 mg/dL (ref 0.0–40.0)

## 2014-07-29 LAB — COMPREHENSIVE METABOLIC PANEL
ALT: 12 U/L (ref 0–53)
AST: 18 U/L (ref 0–37)
Albumin: 3.9 g/dL (ref 3.5–5.2)
Alkaline Phosphatase: 60 U/L (ref 39–117)
BUN: 17 mg/dL (ref 6–23)
CALCIUM: 9.6 mg/dL (ref 8.4–10.5)
CO2: 31 mEq/L (ref 19–32)
Chloride: 104 mEq/L (ref 96–112)
Creatinine, Ser: 1.26 mg/dL (ref 0.40–1.50)
GFR: 59.45 mL/min — AB (ref >60.00)
GLUCOSE: 67 mg/dL — AB (ref 70–99)
POTASSIUM: 4.2 meq/L (ref 3.5–5.1)
Sodium: 137 mEq/L (ref 135–145)
Total Bilirubin: 1 mg/dL (ref 0.2–1.2)
Total Protein: 7.6 g/dL (ref 6.0–8.3)

## 2014-07-29 LAB — HEMOGLOBIN A1C: Hgb A1c MFr Bld: 5.6 % (ref 4.6–6.5)

## 2014-08-05 ENCOUNTER — Ambulatory Visit (INDEPENDENT_AMBULATORY_CARE_PROVIDER_SITE_OTHER): Payer: Medicare HMO | Admitting: Endocrinology

## 2014-08-05 ENCOUNTER — Other Ambulatory Visit: Payer: Self-pay | Admitting: *Deleted

## 2014-08-05 ENCOUNTER — Encounter: Payer: Self-pay | Admitting: Endocrinology

## 2014-08-05 VITALS — BP 118/70 | HR 63 | Temp 98.2°F | Resp 14 | Ht 70.0 in | Wt 222.8 lb

## 2014-08-05 DIAGNOSIS — I10 Essential (primary) hypertension: Secondary | ICD-10-CM | POA: Diagnosis not present

## 2014-08-05 DIAGNOSIS — D509 Iron deficiency anemia, unspecified: Secondary | ICD-10-CM

## 2014-08-05 DIAGNOSIS — E119 Type 2 diabetes mellitus without complications: Secondary | ICD-10-CM

## 2014-08-05 LAB — POCT URINALYSIS DIPSTICK
Blood, UA: NEGATIVE
GLUCOSE UA: NEGATIVE
KETONES UA: NEGATIVE
LEUKOCYTES UA: NEGATIVE
NEG CONTROL: NEGATIVE
Nitrite, UA: NEGATIVE
PH UA: 5
PROTEIN UA: NEGATIVE
Spec Grav, UA: <=1.005
Urobilinogen, UA: NEGATIVE

## 2014-08-05 NOTE — Patient Instructions (Addendum)
Lantus 10 daily 'Reduce Humalog 4-8 units when planning exercise after dinner  Please check blood sugars at least half the time about 2 hours after any meal and 3 times per week on waking up. Please bring blood sugar monitor to each visit. Recommended blood sugar levels about 2 hours after meal is 140  and on waking up 90-110

## 2014-08-05 NOTE — Progress Notes (Signed)
Patient ID: Eric Grant, male   DOB: 01/30/1941, 74 y.o.   MRN: [id number]   Reason for Appointment:  Diabetes and other problems  History of Present Illness    Type 2 DIABETES MELITUS, date of diagnosis:  1988    Previous history: He has been on basal bolus insulin regimen for several years with excellent control He takes mealtime insulin only at breakfast and lunch Also has been taking Actoplusmet with good results for several years and no side effects  Recent history: Taking basal bolus insulin plus Actoplusmet twice a day without side effects  His blood sugars appear to be nearly normal from his A1c which is again 5.6 He did not bring his home blood glucose monitor and could not download his readings today He is not clear about exactly what his blood sugars are at home but he thinks they are all fairly normal He has not changed insulin regimen since the last visit Basal insulin: He takes about the same amount but will take less of the blood sugar is low normal in the morning; has lab glucose was 67 MEALTIME insulin: He is taking relatively fixed doses at breakfast and supper and usually not at lunch since he does not eat much at that time and is usually more active during the day HYPOGLYCEMIA: He thinks this is usually happening when he is more active and recently has had some low sugars after being active in the evening after supper.  Does not adjust his insulin for increase activity after meals and does not take extra snacks also Weight is stable     Oral hypoglycemic drugs:   Actoplusmet   twice a day      Side effects from medications: None Insulin regimen:   LANTUS 10-12 units in the morning. HUMALOG insulin insulin 10 units in the morning and 20 at supper     Proper timing of medications in relation to meals: Yes.          Monitors blood glucose: Marland Kitchen    Glucometer: One Touch.          Blood Glucose readings by recall usually about 100 fasting and no more than about  120 after meals usually, rarely 150         Meals: 3 meals per day.          Physical activity: exercise: at work, gardening        Weight control: He has gained 3 pounds    Wt Readings from Last 3 Encounters:  08/05/14 222 lb 12.8 oz (101.061 kg)  03/29/14 223 lb 6.4 oz (101.334 kg)  11/13/13 228 lb 3.2 oz (103.511 kg)         Diabetes labs:  Lab Results  Component Value Date   HGBA1C 5.6 07/29/2014   HGBA1C 5.6 03/29/2014   HGBA1C 6.4 09/22/2013   Lab Results  Component Value Date   MICROALBUR 5.7* 03/29/2014   LDLCALC 58 07/29/2014   CREATININE 1.26 07/29/2014      Medication List       This list is accurate as of: 08/05/14  9:56 AM.  Always use your most recent med list.               aspirin 81 MG tablet  Take 81 mg by mouth daily.     B-D ULTRAFINE III SHORT PEN 31G X 8 MM Misc  Generic drug:  Insulin Pen Needle  USE THREE TIMES A DAY  carvedilol 12.5 MG tablet  Commonly known as:  COREG  Take 1 tablet (12.5 mg total) by mouth 2 (two) times daily with a meal.     cholecalciferol 400 UNITS Tabs tablet  Commonly known as:  VITAMIN D  Take 400 Units by mouth.     ciprofloxacin 500 MG tablet  Commonly known as:  CIPRO  Take 1 tablet twice daily     colchicine 0.6 MG tablet  Commonly known as:  COLCRYS  Take 1 tablet (0.6 mg total) by mouth daily.     CRESTOR 20 MG tablet  Generic drug:  rosuvastatin  TAKE 1 TABLET DAILY     ferrous fumarate-iron polysaccharide complex 162-115.2 MG Caps  Commonly known as:  TANDEM  Take 1 capsule by mouth daily with breakfast. 2 tablets daily     HUMALOG KWIKPEN 100 UNIT/ML KiwkPen  Generic drug:  insulin lispro  INJECT 10 UNITS UNDER THE SKIN AT BREAKFAST AND 20 UNITS AT SUPPER     HUMALOG KWIKPEN 100 UNIT/ML KiwkPen  Generic drug:  insulin lispro  INJECT 4 UNITS UNDER THE SKIN AT BREAKFAST AND 14 TO 15 UNITS AT SUPPER     Insulin Glargine 100 UNIT/ML Solostar Pen  Commonly known as:  LANTUS  Inject 18  Units into the skin. INJECT 10-12 UNITS IN THE MORNING     ONETOUCH VERIO test strip  Generic drug:  glucose blood  USE TO CHECK BLOOD SUGAR FOUR TIMES A DAY     pioglitazone-metformin 15-850 MG per tablet  Commonly known as:  ACTOPLUS MET  TAKE 1 TABLET TWICE A DAY WITH A MEAL     valsartan-hydrochlorothiazide 320-12.5 MG per tablet  Commonly known as:  DIOVAN-HCT  TAKE 1/2  TABLET DAILY        Allergies: No Known Allergies  Past Medical History  Diagnosis Date  . Seizures 1991  . Urinary tract infection 07/2012    No past surgical history on file.  Family History  Problem Relation Age of Onset  . Hyperlipidemia Mother   . Diabetes Mother   . Hypertension Mother   . Diabetes Father   . Hypertension Father   . Cancer Neg Hx     Social History:  reports that he has never smoked. He has never used smokeless tobacco. His alcohol and drug histories are not on file.  Review of Systems:  Hypertension:  has had long-standing hypertension, currently taking half of  Diovan HCTZ 320/12.5 and carvedilol.  BP at work checked periodically, usually normal range around 120s /70s  Lipids: LDL is excellent in with his taking only half a tablet of Crestor 20 mg HDL improved from before  Lab Results  Component Value Date   CHOL 111 07/29/2014   HDL 44.40 07/29/2014   LDLCALC 58 07/29/2014   TRIG 43.0 07/29/2014   CHOLHDL 3 07/29/2014     Has had mild chronic anemia.  Hemoglobin is normal as of 11/15 Continues to take iron He still hasn't scheduled himself for a colonoscopy because of some insurance questions  Lab Results  Component Value Date   WBC 6.0 03/29/2014   HGB 12.7* 03/29/2014   HCT 41.4 03/29/2014   MCV 88.5 03/29/2014   PLT 260.0 03/29/2014    No recent episodes of gout  CARDIAC history: He has not seen his cardiologist for the last 5 years, previously reportedly had mild LV dysfunction but he is asymptomatic and is exercising and does not have any  shortness of breath with  significant exercise levels.  No edema also    LABS:  No visits with results within 1 Week(s) from this visit. Latest known visit with results is:  Lab on 07/29/2014  Component Date Value Ref Range Status  . Cholesterol 07/29/2014 111  0 - 200 mg/dL Final   ATP III Classification       Desirable:  < 200 mg/dL               Borderline High:  200 - 239 mg/dL          High:  > = 240 mg/dL  . Triglycerides 07/29/2014 43.0  0.0 - 149.0 mg/dL Final   Normal:  <150 mg/dLBorderline High:  150 - 199 mg/dL  . HDL 07/29/2014 44.40  >39.00 mg/dL Final  . VLDL 07/29/2014 8.6  0.0 - 40.0 mg/dL Final  . LDL Cholesterol 07/29/2014 58  0 - 99 mg/dL Final  . Total CHOL/HDL Ratio 07/29/2014 3   Final                  Men          Women1/2 Average Risk     3.4          3.3Average Risk          5.0          4.42X Average Risk          9.6          7.13X Average Risk          15.0          11.0                      . NonHDL 07/29/2014 66.60   Final   NOTE:  Non-HDL goal should be 30 mg/dL higher than patient's LDL goal (i.e. LDL goal of < 70 mg/dL, would have non-HDL goal of < 100 mg/dL)  . Hgb A1c MFr Bld 07/29/2014 5.6  4.6 - 6.5 % Final   Glycemic Control Guidelines for People with Diabetes:Non Diabetic:  <6%Goal of Therapy: <7%Additional Action Suggested:  >8%   . Sodium 07/29/2014 137  135 - 145 mEq/L Final  . Potassium 07/29/2014 4.2  3.5 - 5.1 mEq/L Final  . Chloride 07/29/2014 104  96 - 112 mEq/L Final  . CO2 07/29/2014 31  19 - 32 mEq/L Final  . Glucose, Bld 07/29/2014 67* 70 - 99 mg/dL Final  . BUN 07/29/2014 17  6 - 23 mg/dL Final  . Creatinine, Ser 07/29/2014 1.26  0.40 - 1.50 mg/dL Final  . Total Bilirubin 07/29/2014 1.0  0.2 - 1.2 mg/dL Final  . Alkaline Phosphatase 07/29/2014 60  39 - 117 U/L Final  . AST 07/29/2014 18  0 - 37 U/L Final  . ALT 07/29/2014 12  0 - 53 U/L Final  . Total Protein 07/29/2014 7.6  6.0 - 8.3 g/dL Final  . Albumin 07/29/2014 3.9  3.5 - 5.2  g/dL Final  . Calcium 07/29/2014 9.6  8.4 - 10.5 mg/dL Final  . GFR 07/29/2014 59.45* >60.00 mL/min Final     Examination:   BP 116/68 mmHg  Pulse 63  Temp(Src) 98.2 F (36.8 C)  Resp 14  Ht 5' 10" (1.778 m)  Wt 222 lb 12.8 oz (101.061 kg)  BMI 31.97 kg/m2  SpO2 96%  Body mass index is 31.97 kg/(m^2).   No pedal edema present  ASSESSMENT/ PLAN:     Diabetes  type 2  Blood glucose control appears  fairly good with  normal A1c and  good readings at home On this visit he has not brought his monitor for download, previously has been checking blood sugar somewhat sporadically and usually in the morning only With starting to be more active now he is tending to have some hypoglycemia on the days he is exercising or doing activities like mowing lawns Discussed prevention of hypoglycemia as he is not making any efforts to change his insulin dose pre-meal or extra snacks with exercise This was discussed in detail today Also since his fasting blood sugars are low normal including in the lab he needs to reduce his Lantus slightly Reminded him about when to check his blood sugars as well as blood sugar targets    RENAL insufficiency:  resolved and creatinine is stable in the upper normal range    Hyperlipidemia: Has been treated with Crestor 10 mg with good control, tends to have low normal HDL     Hypertension: Well controlled    Iron deficiency anemia: Improved.  However he still needs colonoscopy which is due and he will schedule  Patient Instructions  Lantus 10 daily 'Reduce Humalog 4-8 units when planning exercise after dinner  Please check blood sugars at least half the time about 2 hours after any meal and 3 times per week on waking up. Please bring blood sugar monitor to each visit. Recommended blood sugar levels about 2 hours after meal is 140  and on waking up 90-110    Counseling time over 50% of today's 25 minute visit   Evangelyn Crouse 08/05/2014, 9:56 AM

## 2014-09-09 ENCOUNTER — Other Ambulatory Visit: Payer: Self-pay | Admitting: *Deleted

## 2014-09-09 ENCOUNTER — Telehealth: Payer: Self-pay | Admitting: Endocrinology

## 2014-09-09 MED ORDER — METFORMIN HCL 850 MG PO TABS: 850.0000 mg | ORAL_TABLET | Freq: Two times a day (BID) | ORAL | Status: DC

## 2014-09-09 MED ORDER — PIOGLITAZONE HCL 15 MG PO TABS: 15.0000 mg | ORAL_TABLET | Freq: Every day | ORAL | Status: DC

## 2014-09-09 MED ORDER — INSULIN LISPRO 100 UNIT/ML (KWIKPEN): PEN_INJECTOR | SUBCUTANEOUS | Status: DC

## 2014-09-09 MED ORDER — ROSUVASTATIN CALCIUM 20 MG PO TABS: 20.0000 mg | ORAL_TABLET | Freq: Every day | ORAL | Status: DC

## 2014-09-09 NOTE — Telephone Encounter (Signed)
Please advise if okay to send the Actos and Metformin separately.

## 2014-09-09 NOTE — Telephone Encounter (Signed)
Patient need refill of Crestor, Humalog. Pharmacy stated  That if patient get these prescriptions separated (Pioglitazone and Metformin)  he can get them free, please advise[facility]

## 2014-09-09 NOTE — Telephone Encounter (Signed)
ok 

## 2014-09-09 NOTE — Telephone Encounter (Signed)
rx sent

## 2014-10-15 ENCOUNTER — Other Ambulatory Visit: Payer: Self-pay | Admitting: *Deleted

## 2014-10-15 MED ORDER — ACCU-CHEK NANO SMARTVIEW W/DEVICE KIT: PACK | Status: DC

## 2014-10-15 MED ORDER — ALCOHOL SWABS 70 % PADS: MEDICATED_PAD | Status: DC

## 2014-10-15 MED ORDER — ACCU-CHEK FASTCLIX LANCETS MISC: Status: DC

## 2014-10-15 MED ORDER — ROSUVASTATIN CALCIUM 20 MG PO TABS
20.0000 mg | ORAL_TABLET | Freq: Every day | ORAL | Status: DC
Start: 2014-10-15 — End: 2014-11-02

## 2014-10-15 MED ORDER — ACCU-CHEK SMARTVIEW CONTROL VI LIQD: Status: DC

## 2014-10-15 MED ORDER — GLUCOSE BLOOD VI STRP: ORAL_STRIP | Status: DC

## 2014-11-02 ENCOUNTER — Other Ambulatory Visit: Payer: Self-pay | Admitting: *Deleted

## 2014-11-02 MED ORDER — ACCU-CHEK FASTCLIX LANCETS MISC: Status: DC

## 2014-11-02 MED ORDER — ACCU-CHEK NANO SMARTVIEW W/DEVICE KIT: PACK | Status: DC

## 2014-11-02 MED ORDER — GLUCOSE BLOOD VI STRP: ORAL_STRIP | Status: DC

## 2014-11-02 MED ORDER — ACCU-CHEK SMARTVIEW CONTROL VI LIQD: Status: DC

## 2014-11-02 MED ORDER — ALCOHOL SWABS 70 % PADS: MEDICATED_PAD | Status: DC

## 2014-11-02 MED ORDER — ROSUVASTATIN CALCIUM 20 MG PO TABS: 20.0000 mg | ORAL_TABLET | Freq: Every day | ORAL | Status: DC

## 2014-11-25 ENCOUNTER — Other Ambulatory Visit: Payer: Self-pay | Admitting: *Deleted

## 2014-11-25 MED ORDER — CARVEDILOL 12.5 MG PO TABS: 12.5000 mg | ORAL_TABLET | Freq: Two times a day (BID) | ORAL | Status: DC

## 2014-11-29 ENCOUNTER — Telehealth: Payer: Self-pay | Admitting: *Deleted

## 2014-11-29 ENCOUNTER — Other Ambulatory Visit: Payer: Self-pay | Admitting: *Deleted

## 2014-11-29 MED ORDER — FERROUS FUM-IRON POLYSACCH 162-115.2 MG PO CAPS: ORAL_CAPSULE | ORAL | Status: DC

## 2014-11-29 NOTE — Telephone Encounter (Signed)
Patient is requesting a refill of Tandem plus, please advise

## 2014-11-30 NOTE — Telephone Encounter (Signed)
ok 

## 2014-12-22 ENCOUNTER — Other Ambulatory Visit: Payer: Self-pay | Admitting: Endocrinology

## 2014-12-27 ENCOUNTER — Other Ambulatory Visit: Payer: Medicare Other

## 2014-12-28 ENCOUNTER — Other Ambulatory Visit: Payer: Self-pay | Admitting: *Deleted

## 2014-12-28 MED ORDER — PIOGLITAZONE HCL 15 MG PO TABS: 15.0000 mg | ORAL_TABLET | Freq: Every day | ORAL | Status: DC

## 2014-12-28 MED ORDER — INSULIN GLARGINE 100 UNIT/ML SOLOSTAR PEN: 18.0000 [IU] | PEN_INJECTOR | Freq: Every day | SUBCUTANEOUS | Status: DC

## 2014-12-28 MED ORDER — METFORMIN HCL 850 MG PO TABS: 850.0000 mg | ORAL_TABLET | Freq: Two times a day (BID) | ORAL | Status: DC

## 2014-12-28 MED ORDER — INSULIN LISPRO 100 UNIT/ML (KWIKPEN): PEN_INJECTOR | SUBCUTANEOUS | Status: DC

## 2014-12-28 MED ORDER — CARVEDILOL 12.5 MG PO TABS: 12.5000 mg | ORAL_TABLET | Freq: Two times a day (BID) | ORAL | Status: DC

## 2014-12-30 ENCOUNTER — Ambulatory Visit: Payer: Medicare Other | Admitting: Endocrinology

## 2015-02-03 ENCOUNTER — Other Ambulatory Visit: Payer: Self-pay | Admitting: *Deleted

## 2015-02-03 MED ORDER — FERROUS FUM-IRON POLYSACCH 162-115.2 MG PO CAPS: ORAL_CAPSULE | ORAL | Status: DC

## 2015-04-25 ENCOUNTER — Other Ambulatory Visit (INDEPENDENT_AMBULATORY_CARE_PROVIDER_SITE_OTHER): Payer: Medicare HMO

## 2015-04-25 DIAGNOSIS — E119 Type 2 diabetes mellitus without complications: Secondary | ICD-10-CM

## 2015-04-25 DIAGNOSIS — E785 Hyperlipidemia, unspecified: Secondary | ICD-10-CM | POA: Diagnosis not present

## 2015-04-25 DIAGNOSIS — D509 Iron deficiency anemia, unspecified: Secondary | ICD-10-CM | POA: Diagnosis not present

## 2015-04-25 LAB — LIPID PANEL
CHOL/HDL RATIO: 3
CHOLESTEROL: 120 mg/dL (ref 0–200)
HDL: 40.3 mg/dL (ref >39.00)
LDL Cholesterol: 67 mg/dL (ref 0–99)
NonHDL: 80.07
TRIGLYCERIDES: 67 mg/dL (ref 0.0–149.0)
VLDL: 13.4 mg/dL (ref 0.0–40.0)

## 2015-04-25 LAB — CBC
HEMATOCRIT: 40.1 % (ref 39.0–52.0)
Hemoglobin: 12.6 g/dL — ABNORMAL LOW (ref 13.0–17.0)
MCHC: 31.6 g/dL (ref 30.0–36.0)
MCV: 87.9 fl (ref 78.0–100.0)
PLATELETS: 250 10*3/uL (ref 150.0–400.0)
RBC: 4.56 Mil/uL (ref 4.22–5.81)
RDW: 14 % (ref 11.5–15.5)
WBC: 5.6 10*3/uL (ref 4.0–10.5)

## 2015-04-25 LAB — BASIC METABOLIC PANEL
BUN: 19 mg/dL (ref 6–23)
CHLORIDE: 106 meq/L (ref 96–112)
CO2: 31 meq/L (ref 19–32)
CREATININE: 1.19 mg/dL (ref 0.40–1.50)
Calcium: 9.7 mg/dL (ref 8.4–10.5)
GFR: 63.37 mL/min (ref >60.00)
GLUCOSE: 97 mg/dL (ref 70–99)
Potassium: 4.3 mEq/L (ref 3.5–5.1)
Sodium: 142 mEq/L (ref 135–145)

## 2015-04-25 LAB — HEMOGLOBIN A1C: HEMOGLOBIN A1C: 6.3 % (ref 4.6–6.5)

## 2015-04-27 ENCOUNTER — Ambulatory Visit (INDEPENDENT_AMBULATORY_CARE_PROVIDER_SITE_OTHER): Payer: Medicare HMO | Admitting: Endocrinology

## 2015-04-27 VITALS — BP 122/66 | HR 72 | Temp 98.4°F | Resp 14 | Ht 70.0 in | Wt 219.4 lb

## 2015-04-27 DIAGNOSIS — Z23 Encounter for immunization: Secondary | ICD-10-CM

## 2015-04-27 DIAGNOSIS — Z1211 Encounter for screening for malignant neoplasm of colon: Secondary | ICD-10-CM

## 2015-04-27 DIAGNOSIS — I10 Essential (primary) hypertension: Secondary | ICD-10-CM | POA: Diagnosis not present

## 2015-04-27 DIAGNOSIS — E119 Type 2 diabetes mellitus without complications: Secondary | ICD-10-CM

## 2015-04-27 DIAGNOSIS — D509 Iron deficiency anemia, unspecified: Secondary | ICD-10-CM

## 2015-04-27 NOTE — Patient Instructions (Signed)
Check blood sugars on waking up 2-3  times a week Also check blood sugars about 2 hours after a meal and do this after different meals by rotation  Recommended blood sugar levels on waking up is 90-130 and about 2 hours after meal is 130-160  Please bring your blood sugar monitor to each visit, thank you  Reduce Tandem to 1 daily

## 2015-04-27 NOTE — Progress Notes (Signed)
Patient ID: Eric Grant, male   DOB: 1940-09-04, 74 y.o.   MRN: [id number]   Reason for Appointment:  Diabetes and other problems  History of Present Illness    Type 2 DIABETES MELITUS, date of diagnosis:  1988    Previous history: He has been on basal bolus insulin regimen for several years with excellent control He takes mealtime insulin only at breakfast and lunch Also has been taking Actoplusmet with good results for several years and no side effects  Recent history:  Insulin regimen:   LANTUS 10-12 units in the morning. HUMALOG insulin insulin 10 units in morning and 12 at supper      Taking basal bolus insulin plus Actos and metformin now He has not been seen since 3/16  Current blood sugar patterns, management and problems identified:  Previously was on Actoplusmet twice a day but now is taking 15 mg Actos separately because of insurance preference  He did not bring his monitor again for download  He thinks his blood sugars are fairly good but he is checking them primarily fasting only  His A1c is slightly higher at 6.3, previously 5.6  He at some point was taking 20 units of Humalog at suppertime and now he says he is generally taking 12-14 units  His lab glucose was fairly normal in the morning No reported hypoglycemia Weight is stable     Proper timing of medications in relation to meals: Yes.          Monitors blood glucose: Marland Kitchen    Glucometer: One Touch.          Blood Glucose readings by recall usually about 90-100 fasting, does not know postprandial readings                   Physical activity: exercise: Lifting boxes at work, some gardening or walking on the track         Weight control:    Wt Readings from Last 3 Encounters:  04/27/15 219 lb 6.4 oz (99.519 kg)  08/05/14 222 lb 12.8 oz (101.061 kg)  03/29/14 223 lb 6.4 oz (101.334 kg)         Diabetes labs:  Lab Results  Component Value Date   HGBA1C 6.3 04/25/2015   HGBA1C 5.6  07/29/2014   HGBA1C 5.6 03/29/2014   Lab Results  Component Value Date   MICROALBUR 5.7* 03/29/2014   LDLCALC 67 04/25/2015   CREATININE 1.19 04/25/2015      Medication List       This list is accurate as of: 04/27/15  4:31 PM.  Always use your most recent med list.               ACCU-CHEK FASTCLIX LANCETS Misc  Use to check blood sugar 4 times per day dx code E11.9     ACCU-CHEK NANO SMARTVIEW W/DEVICE Kit  Use to check blood sugar 4 times per day dx code E11.9     ACCU-CHEK SMARTVIEW CONTROL Liqd  Use as directed     Alcohol Swabs 70 % Pads  Use as directed     aspirin 81 MG tablet  Take 81 mg by mouth daily.     B-D ULTRAFINE III SHORT PEN 31G X 8 MM Misc  Generic drug:  Insulin Pen Needle  USE THREE TIMES A DAY     carvedilol 12.5 MG tablet  Commonly known as:  COREG  Take 1 tablet (12.5 mg total) by mouth 2 (two)  times daily with a meal.     cholecalciferol 400 UNITS Tabs tablet  Commonly known as:  VITAMIN D  Take 400 Units by mouth.     ferrous fumarate-iron polysaccharide complex 162-115.2 MG Caps capsule  Commonly known as:  TANDEM  Take 2 tablets daily     glucose blood test strip  Commonly known as:  ACCU-CHEK SMARTVIEW  Use as instructed to check blood sugar 4 times per day dx code E11.9     Insulin Glargine 100 UNIT/ML Solostar Pen  Commonly known as:  LANTUS  Inject 18 Units into the skin daily. INJECT 10-12 UNITS IN THE MORNING     insulin lispro 100 UNIT/ML KiwkPen  Commonly known as:  HUMALOG KWIKPEN  INJECT 4 UNITS UNDER THE SKIN AT BREAKFAST AND 14 TO 15 UNITS AT SUPPER     metFORMIN 850 MG tablet  Commonly known as:  GLUCOPHAGE  Take 1 tablet (850 mg total) by mouth 2 (two) times daily with a meal.     pioglitazone 15 MG tablet  Commonly known as:  ACTOS  Take 1 tablet (15 mg total) by mouth daily.     rosuvastatin 20 MG tablet  Commonly known as:  CRESTOR  Take 1 tablet (20 mg total) by mouth daily.      valsartan-hydrochlorothiazide 320-12.5 MG tablet  Commonly known as:  DIOVAN-HCT  TAKE 1/2  TABLET DAILY        Allergies: No Known Allergies  Past Medical History  Diagnosis Date  . Seizures 1991  . Urinary tract infection 07/2012    No past surgical history on file.  Family History  Problem Relation Age of Onset  . Hyperlipidemia Mother   . Diabetes Mother   . Hypertension Mother   . Diabetes Father   . Hypertension Father   . Cancer Neg Hx     Social History:  reports that he has never smoked. He has never used smokeless tobacco. His alcohol and drug histories are not on file.  Review of Systems:  Hypertension:  has had long-standing hypertension, currently taking half of  Diovan HCTZ 320/12.5 and carvedilol.  BP checked periodically at home, usually normal range around 120s /70s  Lipids: LDL is excellent in with his taking only half a tablet of Crestor 20 mg   Lab Results  Component Value Date   CHOL 120 04/25/2015   HDL 40.30 04/25/2015   LDLCALC 67 04/25/2015   TRIG 67.0 04/25/2015   CHOLHDL 3 04/25/2015     Has had mild chronic anemia.  Hemoglobin is nearly normal Continues to take iron twice a day   Lab Results  Component Value Date   WBC 5.6 04/25/2015   HGB 12.6* 04/25/2015   HCT 40.1 04/25/2015   MCV 87.9 04/25/2015   PLT 250.0 04/25/2015    No recent episodes of gout  CARDIAC history:  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 but he is asymptomatic and is exercising and does not have any shortness of breath with significant exercise levels.   Continues to take a beta blocker     LABS:  Lab on 04/25/2015  Component Date Value Ref Range Status  . Cholesterol 04/25/2015 120  0 - 200 mg/dL Final   ATP III Classification       Desirable:  < 200 mg/dL               Borderline High:  200 - 239 mg/dL  High:  > = 240 mg/dL  . Triglycerides 04/25/2015 67.0  0.0 - 149.0 mg/dL Final   Normal:  <150 mg/dLBorderline High:  150 - 199 mg/dL    . HDL 04/25/2015 40.30  >39.00 mg/dL Final  . VLDL 04/25/2015 13.4  0.0 - 40.0 mg/dL Final  . LDL Cholesterol 04/25/2015 67  0 - 99 mg/dL Final  . Total CHOL/HDL Ratio 04/25/2015 3   Final                  Men          Women1/2 Average Risk     3.4          3.3Average Risk          5.0          4.42X Average Risk          9.6          7.13X Average Risk          15.0          11.0                      . NonHDL 04/25/2015 80.07   Final   NOTE:  Non-HDL goal should be 30 mg/dL higher than patient's LDL goal (i.e. LDL goal of < 70 mg/dL, would have non-HDL goal of < 100 mg/dL)  Lab on 04/25/2015  Component Date Value Ref Range Status  . Hgb A1c MFr Bld 04/25/2015 6.3  4.6 - 6.5 % Final   Glycemic Control Guidelines for People with Diabetes:Non Diabetic:  <6%Goal of Therapy: <7%Additional Action Suggested:  >8%   . Sodium 04/25/2015 142  135 - 145 mEq/L Final  . Potassium 04/25/2015 4.3  3.5 - 5.1 mEq/L Final  . Chloride 04/25/2015 106  96 - 112 mEq/L Final  . CO2 04/25/2015 31  19 - 32 mEq/L Final  . Glucose, Bld 04/25/2015 97  70 - 99 mg/dL Final  . BUN 04/25/2015 19  6 - 23 mg/dL Final  . Creatinine, Ser 04/25/2015 1.19  0.40 - 1.50 mg/dL Final  . Calcium 04/25/2015 9.7  8.4 - 10.5 mg/dL Final  . GFR 04/25/2015 63.37  >60.00 mL/min Final  . WBC 04/25/2015 5.6  4.0 - 10.5 K/uL Final  . RBC 04/25/2015 4.56  4.22 - 5.81 Mil/uL Final  . Platelets 04/25/2015 250.0  150.0 - 400.0 K/uL Final  . Hemoglobin 04/25/2015 12.6* 13.0 - 17.0 g/dL Final  . HCT 04/25/2015 40.1  39.0 - 52.0 % Final  . MCV 04/25/2015 87.9  78.0 - 100.0 fl Final  . MCHC 04/25/2015 31.6  30.0 - 36.0 g/dL Final  . RDW 04/25/2015 14.0  11.5 - 15.5 % Final     Examination:   BP 122/66 mmHg  Pulse 72  Temp(Src) 98.4 F (36.9 C) (Oral)  Resp 14  Ht '5\' 10"'  (1.778 m)  Wt 219 lb 6.4 oz (99.519 kg)  BMI 31.48 kg/m2  SpO2 96%  Body mass index is 31.48 kg/(m^2).   No pedal edema present  ASSESSMENT/ PLAN:      Diabetes type 2  Blood glucose control appears  fairly good with upper normal A1c See history of present illness for detailed discussion of his current management, blood sugar patterns and problems identified He has checked readings only used occasionally in the mornings and not clear what his patterns are Since his A1c is relatively higher he may have had some higher postprandial  readings that he has not checked Overall he is doing well with dietitian being active even at his age. He will need to adjust his suppertime coverage based on his postprandial readings Reminded him about when to check his blood sugars as well as blood sugar targets     Hyperlipidemia: Has been treated with Crestor 10 mg with good control, tends to have low normal HDL     Hypertension: Well controlled    Iron deficiency anemia: Improved.  He will reduce iron to once a day  Will schedule colonoscopy  Patient Instructions  Check blood sugars on waking up 2-3  times a week Also check blood sugars about 2 hours after a meal and do this after different meals by rotation  Recommended blood sugar levels on waking up is 90-130 and about 2 hours after meal is 130-160  Please bring your blood sugar monitor to each visit, thank you  Reduce Tandem to 1 daily        Jaheem Hedgepath 04/27/2015, 4:31 PM

## 2015-05-12 ENCOUNTER — Other Ambulatory Visit: Payer: Self-pay | Admitting: *Deleted

## 2015-05-12 MED ORDER — INSULIN ASPART 100 UNIT/ML FLEXPEN: PEN_INJECTOR | SUBCUTANEOUS | Status: DC

## 2015-06-24 ENCOUNTER — Other Ambulatory Visit: Payer: Self-pay | Admitting: Endocrinology

## 2015-08-23 ENCOUNTER — Other Ambulatory Visit: Payer: Self-pay | Admitting: Endocrinology

## 2015-09-12 ENCOUNTER — Other Ambulatory Visit: Payer: Self-pay | Admitting: *Deleted

## 2015-09-12 MED ORDER — METFORMIN HCL 850 MG PO TABS: 850.0000 mg | ORAL_TABLET | Freq: Two times a day (BID) | ORAL | Status: DC

## 2015-09-19 ENCOUNTER — Other Ambulatory Visit: Payer: Self-pay | Admitting: *Deleted

## 2015-10-06 ENCOUNTER — Other Ambulatory Visit: Payer: Self-pay | Admitting: Endocrinology

## 2015-10-25 ENCOUNTER — Other Ambulatory Visit: Payer: Medicare HMO

## 2015-10-28 ENCOUNTER — Ambulatory Visit: Payer: Medicare HMO | Admitting: Endocrinology

## 2015-12-02 ENCOUNTER — Other Ambulatory Visit: Payer: Self-pay | Admitting: Endocrinology

## 2016-01-03 ENCOUNTER — Other Ambulatory Visit (INDEPENDENT_AMBULATORY_CARE_PROVIDER_SITE_OTHER): Payer: Medicare HMO

## 2016-01-03 DIAGNOSIS — E119 Type 2 diabetes mellitus without complications: Secondary | ICD-10-CM | POA: Diagnosis not present

## 2016-01-03 DIAGNOSIS — D509 Iron deficiency anemia, unspecified: Secondary | ICD-10-CM | POA: Diagnosis not present

## 2016-01-03 LAB — COMPREHENSIVE METABOLIC PANEL
ALT: 9 U/L (ref 0–53)
AST: 13 U/L (ref 0–37)
Albumin: 3.9 g/dL (ref 3.5–5.2)
Alkaline Phosphatase: 60 U/L (ref 39–117)
BUN: 15 mg/dL (ref 6–23)
CHLORIDE: 103 meq/L (ref 96–112)
CO2: 31 meq/L (ref 19–32)
Calcium: 9.1 mg/dL (ref 8.4–10.5)
Creatinine, Ser: 1.17 mg/dL (ref 0.40–1.50)
GFR: 64.51 mL/min (ref >60.00)
GLUCOSE: 105 mg/dL — AB (ref 70–99)
POTASSIUM: 3.8 meq/L (ref 3.5–5.1)
SODIUM: 137 meq/L (ref 135–145)
Total Bilirubin: 0.8 mg/dL (ref 0.2–1.2)
Total Protein: 7.6 g/dL (ref 6.0–8.3)

## 2016-01-03 LAB — HEMOGLOBIN A1C: HEMOGLOBIN A1C: 5.7 % (ref 4.6–6.5)

## 2016-01-03 LAB — CBC
HCT: 35.8 % — ABNORMAL LOW (ref 39.0–52.0)
HEMOGLOBIN: 11.5 g/dL — AB (ref 13.0–17.0)
MCHC: 32 g/dL (ref 30.0–36.0)
MCV: 86.1 fl (ref 78.0–100.0)
Platelets: 238 10*3/uL (ref 150.0–400.0)
RBC: 4.16 Mil/uL — ABNORMAL LOW (ref 4.22–5.81)
RDW: 14 % (ref 11.5–15.5)
WBC: 5.4 10*3/uL (ref 4.0–10.5)

## 2016-01-03 LAB — MICROALBUMIN / CREATININE URINE RATIO
Creatinine,U: 148.2 mg/dL
MICROALB/CREAT RATIO: 9.7 mg/g (ref 0.0–30.0)
Microalb, Ur: 14.4 mg/dL — ABNORMAL HIGH (ref 0.0–1.9)

## 2016-01-03 LAB — LIPID PANEL
CHOLESTEROL: 121 mg/dL (ref 0–200)
HDL: 36.8 mg/dL — ABNORMAL LOW (ref >39.00)
LDL CALC: 67 mg/dL (ref 0–99)
NonHDL: 84.05
TRIGLYCERIDES: 84 mg/dL (ref 0.0–149.0)
Total CHOL/HDL Ratio: 3
VLDL: 16.8 mg/dL (ref 0.0–40.0)

## 2016-01-06 ENCOUNTER — Encounter: Payer: Self-pay | Admitting: Endocrinology

## 2016-01-06 ENCOUNTER — Ambulatory Visit (INDEPENDENT_AMBULATORY_CARE_PROVIDER_SITE_OTHER): Payer: Medicare HMO | Admitting: Endocrinology

## 2016-01-06 VITALS — BP 130/80 | HR 73 | Ht 70.0 in | Wt 221.0 lb

## 2016-01-06 DIAGNOSIS — Z23 Encounter for immunization: Secondary | ICD-10-CM

## 2016-01-06 DIAGNOSIS — E119 Type 2 diabetes mellitus without complications: Secondary | ICD-10-CM | POA: Diagnosis not present

## 2016-01-06 DIAGNOSIS — E782 Mixed hyperlipidemia: Secondary | ICD-10-CM

## 2016-01-06 DIAGNOSIS — D509 Iron deficiency anemia, unspecified: Secondary | ICD-10-CM | POA: Diagnosis not present

## 2016-01-06 DIAGNOSIS — I10 Essential (primary) hypertension: Secondary | ICD-10-CM | POA: Diagnosis not present

## 2016-01-06 NOTE — Progress Notes (Signed)
Patient ID: Eric Grant, male   DOB: [birth year], 75 y.o.   MRN: [id number]   Reason for Appointment:  Diabetes and other problems  History of Present Illness    Type 2 DIABETES MELITUS, date of diagnosis:  1988    Previous history: He has been on basal bolus insulin regimen for several years with excellent control He takes mealtime insulin only at breakfast and lunch Also has been taking Actoplusmet with good results for several years and no side effects  Recent history:  Insulin regimen:   LANTUS 5 units in the morning and 8 at night  HUMALOG insulin insulin 7-10 units in morning and 14-15 at supper      Taking basal bolus insulin plus Actos and metformin  This is his first visit this year today His A1c again is surprisingly good with normal level of 5.7  Current blood sugar patterns, management and problems identified:  He has adjusted his insulin again on his own  He is taking a very small dose of Lantus in the morning and relatively low doses at suppertime  However still continues to take relatively larger doses of NovoLog especially in the evening  He usually reduces the dose of his morning insulin if he has symptoms of low sugars during the day when he is more active  Blood sugars at home are fairly consistent in the morning ranging from 72-133 but he does not check readings after meals, has only one reading at 7 PM of 125 No reported hypoglycemia Weight is stable     Proper timing of medications in relation to meals: Yes.          Monitors blood glucose: Marland Kitchen    Glucometer: One Touch.          Blood Glucose readings by download as above                  Physical activity: exercise: Lifting boxes at work, some gardening or walking on the track         Weight control:    Wt Readings from Last 3 Encounters:  01/06/16 221 lb (100.2 kg)  04/27/15 219 lb 6.4 oz (99.5 kg)  08/05/14 222 lb 12.8 oz (101.1 kg)         Diabetes labs:  Lab Results  Component  Value Date   HGBA1C 5.7 01/03/2016   HGBA1C 6.3 04/25/2015   HGBA1C 5.6 07/29/2014   Lab Results  Component Value Date   MICROALBUR 14.4 (H) 01/03/2016   LDLCALC 67 01/03/2016   CREATININE 1.17 01/03/2016      Medication List       Accurate as of 01/06/16 11:59 PM. Always use your most recent med list.          ACCU-CHEK FASTCLIX LANCETS Misc Use to check blood sugar 4 times per day dx code E11.9   ACCU-CHEK NANO SMARTVIEW w/Device Kit Use to check blood sugar 4 times per day dx code E11.9   ACCU-CHEK SMARTVIEW CONTROL Liqd Use as directed   Alcohol Swabs 70 % Pads Use as directed   aspirin 81 MG tablet Take 81 mg by mouth daily.   B-D ULTRAFINE III SHORT PEN 31G X 8 MM Misc Generic drug:  Insulin Pen Needle USE THREE TIMES A DAY   carvedilol 12.5 MG tablet Commonly known as:  COREG Take 1 tablet (12.5 mg total) by mouth 2 (two) times daily with a meal.   cholecalciferol 400 units Tabs tablet Commonly  known as:  VITAMIN D Take 400 Units by mouth.   glucose blood test strip Commonly known as:  ACCU-CHEK SMARTVIEW Use as instructed to check blood sugar 4 times per day dx code E11.9   insulin aspart 100 UNIT/ML FlexPen Commonly known as:  NOVOLOG FLEXPEN Inject 4 units at breakfast and 14-15 at supper   Insulin Glargine 100 UNIT/ML Solostar Pen Commonly known as:  LANTUS Inject 18 Units into the skin daily. INJECT 10-12 UNITS IN THE MORNING   insulin lispro 100 UNIT/ML KiwkPen Commonly known as:  HUMALOG KWIKPEN INJECT 4 UNITS UNDER THE SKIN AT BREAKFAST AND 14 TO 15 UNITS AT SUPPER   metFORMIN 850 MG tablet Commonly known as:  GLUCOPHAGE Take 1 tablet (850 mg total) by mouth 2 (two) times daily with a meal.   pioglitazone 15 MG tablet Commonly known as:  ACTOS TAKE ONE TABLET BY MOUTH ONCE DAILY   rosuvastatin 20 MG tablet Commonly known as:  CRESTOR TAKE 1 TABLET EVERY DAY   TANDEM 162-115.2 MG Caps capsule Generic drug:  ferrous fumarate-iron  polysaccharide complex TAKE 2 CAPSULES ONE TIME DAILY   valsartan-hydrochlorothiazide 320-12.5 MG tablet Commonly known as:  DIOVAN-HCT TAKE 1/2  TABLET DAILY       Allergies: No Known Allergies  Past Medical History:  Diagnosis Date  . Seizures ([place]) 1991  . Urinary tract infection 07/2012    No past surgical history on file.  Family History  Problem Relation Age of Onset  . Hyperlipidemia Mother   . Diabetes Mother   . Hypertension Mother   . Diabetes Father   . Hypertension Father   . Cancer Neg Hx     Social History:  reports that he has never smoked. He has never used smokeless tobacco. His alcohol and drug histories are not on file.  Review of Systems:  He has not established himself with his new PCP as yet  Hypertension:  has had long-standing hypertension, currently taking half of  Diovan HCTZ 320/12.5 and carvedilol.  BP checked periodically at home, usually normal range around 120s /70s  Lipids: LDL is excellent  with his taking only half a tablet of Crestor 20 mg   Lab Results  Component Value Date   CHOL 121 01/03/2016   HDL 36.80 (L) 01/03/2016   LDLCALC 67 01/03/2016   TRIG 84.0 01/03/2016   CHOLHDL 3 01/03/2016     Has had mild chronic anemia.  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   Continues to take a beta blocker     LABS:  Lab on 01/03/2016  Component Date Value Ref Range Status  . Hgb A1c MFr Bld 01/03/2016 5.7  4.6 - 6.5 % Final  . Sodium 01/03/2016 137  135 - 145 mEq/L Final  . Potassium 01/03/2016 3.8  3.5 - 5.1 mEq/L Final  .  Chloride 01/03/2016 103  96 - 112 mEq/L Final  . CO2 01/03/2016 31  19 - 32 mEq/L Final  . Glucose, Bld 01/03/2016 105* 70 - 99 mg/dL Final  .  BUN 01/03/2016 15  6 - 23 mg/dL Final  . Creatinine, Ser 01/03/2016 1.17  0.40 - 1.50 mg/dL Final  . Total Bilirubin 01/03/2016 0.8  0.2 - 1.2 mg/dL Final  . Alkaline Phosphatase 01/03/2016 60  39 - 117 U/L Final  . AST 01/03/2016 13  0 - 37 U/L Final  . ALT 01/03/2016 9  0 - 53 U/L Final  . Total Protein 01/03/2016 7.6  6.0 - 8.3 g/dL Final  . Albumin 01/03/2016 3.9  3.5 - 5.2 g/dL Final  . Calcium 01/03/2016 9.1  8.4 - 10.5 mg/dL Final  . GFR 01/03/2016 64.51  >60.00 mL/min Final  . Microalb, Ur 01/03/2016 14.4* 0.0 - 1.9 mg/dL Final  . Creatinine,U 01/03/2016 148.2  mg/dL Final  . Microalb Creat Ratio 01/03/2016 9.7  0.0 - 30.0 mg/g Final  . Cholesterol 01/03/2016 121  0 - 200 mg/dL Final  . Triglycerides 01/03/2016 84.0  0.0 - 149.0 mg/dL Final  . HDL 01/03/2016 36.80* >39.00 mg/dL Final  . VLDL 01/03/2016 16.8  0.0 - 40.0 mg/dL Final  . LDL Cholesterol 01/03/2016 67  0 - 99 mg/dL Final  . Total CHOL/HDL Ratio 01/03/2016 3   Final  . NonHDL 01/03/2016 84.05   Final  . WBC 01/03/2016 5.4  4.0 - 10.5 K/uL Final  . RBC 01/03/2016 4.16* 4.22 - 5.81 Mil/uL Final  . Platelets 01/03/2016 238.0  150.0 - 400.0 K/uL Final  . Hemoglobin 01/03/2016 11.5* 13.0 - 17.0 g/dL Final  . HCT 01/03/2016 35.8* 39.0 - 52.0 % Final  . MCV 01/03/2016 86.1  78.0 - 100.0 fl Final  . MCHC 01/03/2016 32.0  30.0 - 36.0 g/dL Final  . RDW 01/03/2016 14.0  11.5 - 15.5 % Final     Examination:   BP 130/80   Pulse 73   Ht '5\' 10"'  (1.778 m)   Wt 221 lb (100.2 kg)   SpO2 95%   BMI 31.71 kg/m   Body mass index is 31.71 kg/m.   No pedal edema present  Diabetic Foot Exam - Simple   Simple Foot Form Diabetic Foot exam was performed with the following findings:  Yes 01/06/2016  9:29 AM  Visual Inspection No deformities, no ulcerations, no other skin breakdown  bilaterally:  Yes Sensation Testing Intact to touch and monofilament testing bilaterally:  Yes Pulse Check Posterior Tibialis and Dorsalis pulse intact bilaterally:  Yes Comments      ASSESSMENT/ PLAN:     Diabetes type 2  Blood glucose control appears  fairly good with upper normal A1c, Now 5.7  See history of present illness for detailed discussion of his current management, blood sugar patterns and problems identified  He has checked readings only  in the mornings and not clear what his patterns are However since A1c is better and he does not report hypoglycemia he probably has good control after meals Diabetes has been fairly stable for several years now He is very active especially at work  However discussed that since he will be retiring soon he will need to start an exercise program and possibly need more Lantus insulin in the morning He will need to adjust his suppertime coverage based on his postprandial readings Reminded him about when to check his blood sugars after meals     Hyperlipidemia: Has been treated with Crestor 10 mg with good control, tends to have low normal HDL     Hypertension: Well controlled    Iron deficiency anemia: Somewhat worse, no etiology evident  as he is already taking iron He will need further evaluation and will defer to PCP  He will schedule colonoscopy  Influenza vaccine given  Total visit time in giving multiple problems and counseling = 25 minutes  There are no Patient Instructions on file for this visit.   Randee Upchurch 01/09/2016, 9:40 PM

## 2016-01-17 ENCOUNTER — Other Ambulatory Visit: Payer: Self-pay | Admitting: Endocrinology

## 2016-01-27 ENCOUNTER — Other Ambulatory Visit: Payer: Self-pay | Admitting: *Deleted

## 2016-01-27 ENCOUNTER — Telehealth: Payer: Self-pay | Admitting: Endocrinology

## 2016-01-27 MED ORDER — ROSUVASTATIN CALCIUM 20 MG PO TABS: 20.0000 mg | ORAL_TABLET | Freq: Every day | ORAL | 0 refills | Status: DC

## 2016-01-27 NOTE — Telephone Encounter (Signed)
Patient need a week supply of rosuvastatin from his pharmacy because the was a mix up with shipment of his medication and had to be reshipped. Any questions please call  [facility] [address] [place], [place] - [address] [phone] (Phone) [phone] (Fax)

## 2016-01-27 NOTE — Telephone Encounter (Signed)
Rx sent 

## 2016-02-06 ENCOUNTER — Other Ambulatory Visit: Payer: Self-pay | Admitting: Endocrinology

## 2016-02-28 ENCOUNTER — Other Ambulatory Visit: Payer: Self-pay | Admitting: Endocrinology

## 2016-03-12 ENCOUNTER — Other Ambulatory Visit: Payer: Self-pay | Admitting: Endocrinology

## 2016-03-21 LAB — HM DIABETES EYE EXAM

## 2016-03-23 ENCOUNTER — Other Ambulatory Visit: Payer: Self-pay | Admitting: Endocrinology

## 2016-04-06 ENCOUNTER — Encounter: Payer: Self-pay | Admitting: Endocrinology

## 2016-05-04 ENCOUNTER — Other Ambulatory Visit: Payer: Self-pay | Admitting: Endocrinology

## 2016-05-09 ENCOUNTER — Other Ambulatory Visit: Payer: Self-pay | Admitting: Endocrinology

## 2016-06-13 ENCOUNTER — Other Ambulatory Visit: Payer: Self-pay | Admitting: Endocrinology

## 2016-06-18 ENCOUNTER — Other Ambulatory Visit: Payer: Self-pay

## 2016-06-18 MED ORDER — CARVEDILOL 12.5 MG PO TABS: ORAL_TABLET | ORAL | 1 refills | Status: DC

## 2016-07-05 ENCOUNTER — Other Ambulatory Visit: Payer: Medicare HMO

## 2016-07-10 ENCOUNTER — Other Ambulatory Visit: Payer: Self-pay

## 2016-07-10 ENCOUNTER — Ambulatory Visit (INDEPENDENT_AMBULATORY_CARE_PROVIDER_SITE_OTHER): Payer: Medicare HMO | Admitting: Endocrinology

## 2016-07-10 ENCOUNTER — Encounter: Payer: Self-pay | Admitting: Endocrinology

## 2016-07-10 VITALS — BP 145/86 | HR 65 | Ht 70.0 in | Wt 224.0 lb

## 2016-07-10 DIAGNOSIS — I10 Essential (primary) hypertension: Secondary | ICD-10-CM

## 2016-07-10 DIAGNOSIS — Z794 Long term (current) use of insulin: Secondary | ICD-10-CM | POA: Diagnosis not present

## 2016-07-10 DIAGNOSIS — E1165 Type 2 diabetes mellitus with hyperglycemia: Secondary | ICD-10-CM | POA: Diagnosis not present

## 2016-07-10 DIAGNOSIS — M1000 Idiopathic gout, unspecified site: Secondary | ICD-10-CM

## 2016-07-10 DIAGNOSIS — E119 Type 2 diabetes mellitus without complications: Secondary | ICD-10-CM | POA: Diagnosis not present

## 2016-07-10 DIAGNOSIS — D508 Other iron deficiency anemias: Secondary | ICD-10-CM | POA: Diagnosis not present

## 2016-07-10 LAB — CBC WITH DIFFERENTIAL/PLATELET
BASOS ABS: 0.1 10*3/uL (ref 0.0–0.1)
Basophils Relative: 1 % (ref 0.0–3.0)
EOS ABS: 0.4 10*3/uL (ref 0.0–0.7)
Eosinophils Relative: 6.2 % — ABNORMAL HIGH (ref 0.0–5.0)
HCT: 41.7 % (ref 39.0–52.0)
Hemoglobin: 13.3 g/dL (ref 13.0–17.0)
LYMPHS ABS: 1.2 10*3/uL (ref 0.7–4.0)
Lymphocytes Relative: 17.9 % (ref 12.0–46.0)
MCHC: 31.8 g/dL (ref 30.0–36.0)
MCV: 89.1 fl (ref 78.0–100.0)
MONOS PCT: 7.2 % (ref 3.0–12.0)
Monocytes Absolute: 0.5 10*3/uL (ref 0.1–1.0)
NEUTROS ABS: 4.4 10*3/uL (ref 1.4–7.7)
NEUTROS PCT: 67.7 % (ref 43.0–77.0)
PLATELETS: 234 10*3/uL (ref 150.0–400.0)
RBC: 4.68 Mil/uL (ref 4.22–5.81)
RDW: 14.4 % (ref 11.5–15.5)
WBC: 6.5 10*3/uL (ref 4.0–10.5)

## 2016-07-10 LAB — BASIC METABOLIC PANEL
BUN: 12 mg/dL (ref 6–23)
CALCIUM: 9.5 mg/dL (ref 8.4–10.5)
CO2: 31 mEq/L (ref 19–32)
CREATININE: 1.26 mg/dL (ref 0.40–1.50)
Chloride: 104 mEq/L (ref 96–112)
GFR: 59.14 mL/min — AB (ref >60.00)
GLUCOSE: 99 mg/dL (ref 70–99)
Potassium: 4.3 mEq/L (ref 3.5–5.1)
Sodium: 141 mEq/L (ref 135–145)

## 2016-07-10 LAB — IBC PANEL
Iron: 43 ug/dL (ref 42–165)
SATURATION RATIOS: 11.5 % — AB (ref 20.0–50.0)
Transferrin: 268 mg/dL (ref 212.0–360.0)

## 2016-07-10 LAB — HEMOGLOBIN A1C: Hgb A1c MFr Bld: 6.3 % (ref 4.6–6.5)

## 2016-07-10 LAB — POCT GLYCOSYLATED HEMOGLOBIN (HGB A1C): Hemoglobin A1C: 6.1

## 2016-07-10 LAB — URIC ACID: Uric Acid, Serum: 6.8 mg/dL (ref 4.0–7.8)

## 2016-07-10 MED ORDER — PIOGLITAZONE HCL 15 MG PO TABS: 15.0000 mg | ORAL_TABLET | Freq: Every day | ORAL | 2 refills | Status: DC

## 2016-07-10 MED ORDER — VALSARTAN-HYDROCHLOROTHIAZIDE 320-12.5 MG PO TABS: 0.5000 | ORAL_TABLET | Freq: Every day | ORAL | 3 refills | Status: DC

## 2016-07-10 MED ORDER — INSULIN DEGLUDEC 100 UNIT/ML ~~LOC~~ SOPN
18.0000 [IU] | PEN_INJECTOR | Freq: Every day | SUBCUTANEOUS | 2 refills | Status: DC
Start: 2016-07-10 — End: 2017-08-20

## 2016-07-10 NOTE — Patient Instructions (Addendum)
LANTUS insulin: Adjust the morning insulin dose based on the pattern of her blood sugars at suppertime before eating Adjust the evening dose of Lantus based on the pattern of her blood sugars in the MORNING  Must check blood sugars periodically about 2 hours after breakfast or suppertime as this will indicate if you're getting enough NovoLog for those meals  When the Lantus is finished [facility] the [facility] starting with 18 units in the evening only.  This will also be adjusted based on the MORNING blood sugar pattern on a weekly basis  Restart PIOGLITAZONE  Restart VALSARTAN Start regular walking for exercise  Bring home blood pressure monitor for comparison

## 2016-07-10 NOTE — Progress Notes (Signed)
Patient ID: Eric Grant, male   DOB: 1940/10/18, 76 y.o.   MRN: [id number]   Reason for Appointment:  Diabetes and other problems  History of Present Illness    Type 2 DIABETES MELITUS, date of diagnosis:  1988    Previous history: He has been on basal bolus insulin regimen for several years with excellent control He takes mealtime insulin only at breakfast and lunch Also has been taking Actoplusmet with good results for several years and no side effects  Recent history:  Insulin regimen:   LANTUS 4 units in the morning and 14-15 at night NOVOLOG insulin insulin 4-10 units in morning and 15 at supper  His A1c again is good with normal level of  6.1, howeer [id number]     Taking basal bolus insulin plus  metformin   Current blood sugar patterns, management and problems identified:  Even though he had been taking pioglitazone along with his metformin for quite some time he says he is not taking it, this was prescribed in November  He has adjusted his insulin again on his own and now appears to be adjusting his morning Lantus also based on his pre-meal blood sugar in the morning  He does not check his blood sugar in the afternoons or evenings at all  Does not report hypoglycemia but also is not very active now  His fasting blood sugars are overall fairly good although sporadically will have higher readings and he is not clear why.  He is taking about the same dosage of insulin to cover his evening meal daily without adjusting based on meal size or carbohydrate intake  He does try to take his NovoLog before eating  Weight is stable     Proper timing of medications in relation to meals: Yes.          Monitors blood glucose: Marland Kitchen    Glucometer: One Touch.          Blood Glucose readings by download   Mean values apply above for all meters except median for One Touch  PRE-MEAL Fasting Lunch Dinner Bedtime Overall  Glucose range:  79-191      Mean/median:  120                         Physical activity: exercise:  some gardening       Weight control:    Wt Readings from Last 3 Encounters:  07/10/16 224 lb (101.6 kg)  01/06/16 221 lb (100.2 kg)  04/27/15 219 lb 6.4 oz (99.5 kg)         Diabetes labs:  Lab Results  Component Value Date   HGBA1C 6.1 07/10/2016   HGBA1C 5.7 01/03/2016   HGBA1C 6.3 04/25/2015   Lab Results  Component Value Date   MICROALBUR 14.4 (H) 01/03/2016   LDLCALC 67 01/03/2016   CREATININE 1.17 01/03/2016    OTHER active problems: See review of systems   Allergies as of 07/10/2016   No Known Allergies     Medication List       Accurate as of 07/10/16  9:26 AM. Always use your most recent med list.          ACCU-CHEK FASTCLIX LANCETS Misc USE  TO CHECK BLOOD SUGAR FOUR TIMES DAILY   ACCU-CHEK NANO SMARTVIEW w/Device Kit Use to check blood sugar 4 times per day dx code E11.9   Alcohol Swabs 70 % Pads Use as directed   aspirin 81  MG tablet Take 81 mg by mouth daily.   B-D ULTRAFINE III SHORT PEN 31G X 8 MM Misc Generic drug:  Insulin Pen Needle USE THREE TIMES A DAY   carvedilol 12.5 MG tablet Commonly known as:  COREG TAKE 1 TABLET TWO TIMES DAILY WITH A MEAL.   cholecalciferol 400 units Tabs tablet Commonly known as:  VITAMIN D Take 400 Units by mouth.   glucose blood test strip Commonly known as:  ACCU-CHEK SMARTVIEW Use as instructed to check blood sugar 4 times per day dx code E11.9   insulin degludec 100 UNIT/ML Sopn FlexTouch Pen Commonly known as:  TRESIBA FLEXTOUCH Inject 0.18 mLs (18 Units total) into the skin daily at 10 pm.   LANTUS SOLOSTAR 100 UNIT/ML Solostar Pen Generic drug:  Insulin Glargine INJECT 10-12 UNITS IN THE MORNING (DISCARD AND BEGIN A NEW PEN AFTER 28 DAYS)   metFORMIN 850 MG tablet Commonly known as:  GLUCOPHAGE TAKE 1 TABLET TWICE DAILY  WITH  MEAL   NOVOLOG FLEXPEN 100 UNIT/ML FlexPen Generic drug:  insulin aspart INJECT 4 UNITS SUBCUTANEOUSLY AT  BREAKFAST AND 14 TO 15 UNITS  AT SUPPER   pioglitazone 15 MG tablet Commonly known as:  ACTOS Take 1 tablet (15 mg total) by mouth daily.   rosuvastatin 20 MG tablet Commonly known as:  CRESTOR TAKE 1 TABLET EVERY DAY   TANDEM 162-115.2 MG Caps capsule Generic drug:  ferrous fumarate-iron polysaccharide complex TAKE 2 CAPSULES ONE TIME DAILY   valsartan-hydrochlorothiazide 320-12.5 MG tablet Commonly known as:  DIOVAN-HCT Take 0.5 tablets by mouth daily.       Allergies: No Known Allergies  Past Medical History:  Diagnosis Date  . Seizures ([place]) 1991  . Urinary tract infection 07/2012    No past surgical history on file.  Family History  Problem Relation Age of Onset  . Hyperlipidemia Mother   . Diabetes Mother   . Hypertension Mother   . Diabetes Father   . Hypertension Father   . Cancer Neg Hx     Social History:  reports that he has never smoked. He has never used smokeless tobacco. His alcohol and drug histories are not on file.  Review of Systems:  He has not established himself with his new PCP as yet  Hypertension:  has had long-standing hypertension, Was taking half of  Diovan HCTZ 320/12.5 and carvedilol.  BP checked periodically at home, usually normal range around 110s /70s He apparently is not taking his valsartan and he does not know when he left it off.  Blood pressure is higher than usual today in the office  BP Readings from Last 3 Encounters:  07/10/16 (!) 145/86  01/06/16 130/80  04/27/15 122/66     Lipids: LDL is excellent  with his taking only half a tablet of Crestor 20 mg   Lab Results  Component Value Date   CHOL 121 01/03/2016   HDL 36.80 (L) 01/03/2016   LDLCALC 67 01/03/2016   TRIG 84.0 01/03/2016   CHOLHDL 3 01/03/2016     Has had mild chronic anemia With previous iron deficiency.   Continues to take iron He was recommended COLONOSCOPY but he was told he cannot do it without a referral  Lab Results  Component Value  Date   WBC 5.4 01/03/2016   HGB 11.5 (L) 01/03/2016   HCT 35.8 (L) 01/03/2016   MCV 86.1 01/03/2016   PLT 238.0 01/03/2016    Had only a minor recent episode of gout, resolves  on own  CARDIAC history:  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 but he is asymptomatic and does not have any shortness of breath with significant exercise levels.   Continues to take a beta blocker Does not follow with cardiologist now    He says he is getting recurrent colds with some cough and clear sputum   LABS:  Office Visit on 07/10/2016  Component Date Value Ref Range Status  . Hemoglobin A1C 07/10/2016 6.1   Final     Examination:   BP (!) 145/86 (BP Location: Left Arm)   Pulse 65   Ht '5\' 10"'  (1.778 m)   Wt 224 lb (101.6 kg)   SpO2 97%   BMI 32.14 kg/m   Body mass index is 32.14 kg/m.   No  edema present    ASSESSMENT/ PLAN:    Diabetes type 2  See history of present illness for detailed discussion of his current management, blood sugar patterns and problems identified Blood glucose control appears Reasonably good with A1c 6.1 although higher than before  Not clear if he is having any postprandial hyperglycemia as he is checking blood sugars only in the morning Blood sugar may be trending higher with his not taking pioglitazone which probably would be helpful for his low HDL also  Have given written instructions for him to check his blood sugar at various times and can continue to use the Verio monitor as long as it is covered He can switch Lantus to [place] on the next prescription for more consistent control and once a day convenience Discussed the differences between the 2 insulins and how to titrate the dose based on weekly fasting patterns The date and time was reprogrammed on his monitor  Also discussed checking blood sugars after meals to help adjust the Novolog and also helping guide in adjusting the dose for various Meals Encouraged Him to Walk Regularly      Hyperlipidemia: Has been treated with Crestor 10 mg with good control, tends to have low normal HDL, needs follow-up periodically     Hypertension: Not controlled and likely because of his not taking his valsartan as before.  Since he has reportedly History of gout will not give him 12.5 mg of HCTZ and he can continue his Diovan HCTZ half tablet daily, new prescription will be sent    Iron deficiency anemia: Needs follow-up cbc and iron level.  Is taking iron   He will schedule colonoscopy, referral to be made  ?  Gout, he reports only minor episodes and will need to recheck his uric acid  Total visit time in evaluation and management of multiple problems and counseling = 25 minutes  Patient Instructions  LANTUS insulin: Adjust the morning insulin dose based on the pattern of her blood sugars at suppertime before eating Adjust the evening dose of Lantus based on the pattern of her blood sugars in the MORNING  Must check blood sugars periodically about 2 hours after breakfast or suppertime as this will indicate if you're getting enough NovoLog for those meals  When the Lantus is finished change the prescription to [facility] once a day starting with 18 units in the evening only.  This will also be adjusted based on the MORNING blood sugar pattern on a weekly basis  Restart PIOGLITAZONE  Restart VALSARTAN Start regular walking for exercise  Bring home blood pressure monitor for comparison    Eric Grant 07/10/2016, 9:26 AM

## 2016-07-16 NOTE — Progress Notes (Signed)
Please let patient know that the lab result is normal and no further action needed

## 2016-09-04 ENCOUNTER — Ambulatory Visit
Admission: RE | Admit: 2016-09-04 | Discharge: 2016-09-04 | Disposition: A | Discharge status: Home / Self Care | Payer: Medicare HMO | Source: Ambulatory Visit | Attending: Internal Medicine | Admitting: Internal Medicine

## 2016-09-04 ENCOUNTER — Other Ambulatory Visit: Payer: Self-pay | Admitting: Internal Medicine

## 2016-09-04 DIAGNOSIS — R05 Cough: Secondary | ICD-10-CM

## 2016-09-04 DIAGNOSIS — R059 Cough, unspecified: Secondary | ICD-10-CM

## 2016-09-12 ENCOUNTER — Encounter: Payer: Self-pay | Admitting: Endocrinology

## 2016-10-05 ENCOUNTER — Other Ambulatory Visit: Payer: Medicare HMO

## 2016-10-10 ENCOUNTER — Ambulatory Visit: Payer: Medicare HMO | Admitting: Endocrinology

## 2016-11-09 ENCOUNTER — Other Ambulatory Visit (INDEPENDENT_AMBULATORY_CARE_PROVIDER_SITE_OTHER): Payer: Medicare HMO

## 2016-11-09 DIAGNOSIS — Z794 Long term (current) use of insulin: Secondary | ICD-10-CM | POA: Diagnosis not present

## 2016-11-09 DIAGNOSIS — E1165 Type 2 diabetes mellitus with hyperglycemia: Secondary | ICD-10-CM | POA: Diagnosis not present

## 2016-11-09 LAB — COMPREHENSIVE METABOLIC PANEL
ALBUMIN: 3.8 g/dL (ref 3.5–5.2)
ALT: 6 U/L (ref 0–53)
AST: 9 U/L (ref 0–37)
Alkaline Phosphatase: 67 U/L (ref 39–117)
BUN: 17 mg/dL (ref 6–23)
CALCIUM: 9.6 mg/dL (ref 8.4–10.5)
CHLORIDE: 104 meq/L (ref 96–112)
CO2: 31 mEq/L (ref 19–32)
Creatinine, Ser: 1.29 mg/dL (ref 0.40–1.50)
GFR: 57.5 mL/min — ABNORMAL LOW (ref >60.00)
Glucose, Bld: 80 mg/dL (ref 70–99)
POTASSIUM: 4.2 meq/L (ref 3.5–5.1)
SODIUM: 140 meq/L (ref 135–145)
Total Bilirubin: 0.7 mg/dL (ref 0.2–1.2)
Total Protein: 7.7 g/dL (ref 6.0–8.3)

## 2016-11-09 LAB — HEMOGLOBIN A1C: Hgb A1c MFr Bld: 5.5 % (ref 4.6–6.5)

## 2016-11-13 NOTE — Progress Notes (Signed)
Patient ID: Eric Grant, male   DOB: 1941/03/22, 76 y.o.   MRN: [id number]   Reason for Appointment:  Diabetes and other problems  History of Present Illness    Type 2 DIABETES MELITUS, date of diagnosis:  1988    Previous history: He has been on basal bolus insulin regimen for several years with excellent control He takes mealtime insulin only at breakfast and lunch Also has been taking Actoplusmet with good results for several years and no side effects  Recent history:  Insulin regimen:   TRESIBA 17-18 units in morning NOVOLOG insulin insulin 0-4 units in morning and 14 at supper  His A1c again is good with normal level of 5.5, previously 6.1   Oral hypoglycemic drugs: metformin 850 twice a day  Current blood sugar patterns, management and problems identified:  He did bring his monitor for download and appears to be checking only fasting readings except occasionally at night or suppertime  He has more consistent morning readings with taking [place]iba compared to Lantus  However FASTING appears to be low normal and generally below 100; he does not feel hypoglycemic overnight  His monitor has incorrect date programmed and analysis cannot be done  Also does not check blood sugars after supper and not clear if he is high at that time, he does not take any Novolog in the morning as before unless blood sugar is high  Does have sporadic high readings which are minimal and highest blood sugar was 283 a few days ago after eating poorly at a party  Has only rare low blood sugars documented over the last couple of months in the 50s, once fasting  Weight is stable     Proper timing of medications in relation to meals: Yes.          Monitors blood glucose: Marland Kitchen.    Glucometer: One Touch.          Blood Glucose readings by download   Mean values apply above for all meters except median for One Touch  PRE-MEAL Fasting Lunch Dinner Bedtime Overall  Glucose range:  79-191    180, 116, 283    Mean/median:  120                        Physical activity: exercise:  some gardening And otherwise active outdoors      Weight control:    Wt Readings from Last 3 Encounters:  11/14/16 223 lb 6.4 oz (101.3 kg)  07/10/16 224 lb (101.6 kg)  01/06/16 221 lb (100.2 kg)         Diabetes labs:  Lab Results  Component Value Date   HGBA1C 5.5 11/09/2016   HGBA1C 6.1 07/10/2016   HGBA1C 6.3 07/10/2016   Lab Results  Component Value Date   MICROALBUR 14.4 (H) 01/03/2016   LDLCALC 67 01/03/2016   CREATININE 1.29 11/09/2016    OTHER active problems: See review of systems   Allergies as of 11/14/2016   No Known Allergies     Medication List       Accurate as of 11/14/16  8:48 AM. Always use your most recent med list.          ACCU-CHEK FASTCLIX LANCETS Misc USE  TO CHECK BLOOD SUGAR FOUR TIMES DAILY   Alcohol Swabs 70 % Pads Use as directed   allopurinol 100 MG tablet Commonly known as:  ZYLOPRIM Take 100 mg by mouth daily.   aspirin 81  MG tablet Take 81 mg by mouth daily.   B-D ULTRAFINE III SHORT PEN 31G X 8 MM Misc Generic drug:  Insulin Pen Needle USE THREE TIMES A DAY   carvedilol 12.5 MG tablet Commonly known as:  COREG TAKE 1 TABLET TWO TIMES DAILY WITH A MEAL.   cholecalciferol 400 units Tabs tablet Commonly known as:  VITAMIN D Take 400 Units by mouth.   insulin degludec 100 UNIT/ML Sopn FlexTouch Pen Commonly known as:  TRESIBA FLEXTOUCH Inject 0.18 mLs (18 Units total) into the skin daily at 10 pm.   metFORMIN 850 MG tablet Commonly known as:  GLUCOPHAGE TAKE 1 TABLET TWICE DAILY  WITH  MEAL   NOVOLOG FLEXPEN 100 UNIT/ML FlexPen Generic drug:  insulin aspart INJECT 4 UNITS SUBCUTANEOUSLY AT BREAKFAST AND 14 TO 15 UNITS  AT SUPPER   ONETOUCH VERIO test strip Generic drug:  glucose blood 2-3 each by Other route daily. Use to check blood sugars 2-3 times daily   pioglitazone 15 MG tablet Commonly known as:  ACTOS Take 1  tablet (15 mg total) by mouth daily.   rosuvastatin 20 MG tablet Commonly known as:  CRESTOR TAKE 1 TABLET EVERY DAY   TANDEM 162-115.2 MG Caps capsule Generic drug:  ferrous fumarate-iron polysaccharide complex TAKE 2 CAPSULES ONE TIME DAILY   valsartan-hydrochlorothiazide 320-12.5 MG tablet Commonly known as:  DIOVAN-HCT Take 0.5 tablets by mouth daily.       Allergies: No Known Allergies  Past Medical History:  Diagnosis Date  . Seizures ([facility]CC) 1991  . Urinary tract infection 07/2012    No past surgical history on file.  Family History  Problem Relation Age of Onset  . Hyperlipidemia Mother   . Diabetes Mother   . Hypertension Mother   . Diabetes Father   . Hypertension Father   . Cancer Neg Hx     Social History:  reports that he has never smoked. He has never used smokeless tobacco. His alcohol and drug histories are not on file.  Review of Systems:  He has not established himself with his new PCP as yet  Hypertension:  has had long-standing hypertension, Is back to taking half of  Diovan HCTZ 320/12.5 and carvedilol.  BP checked periodically at home   BP Readings from Last 3 Encounters:  11/14/16 130/76  07/10/16 (!) 145/86  01/06/16 130/80     Lipids: LDL is excellent  with his taking only half a tablet of Crestor 20 mg, Is going to get follow-up with PCP next month   Lab Results  Component Value Date   CHOL 121 01/03/2016   HDL 36.80 (L) 01/03/2016   LDLCALC 67 01/03/2016   TRIG 84.0 01/03/2016   CHOLHDL 3 01/03/2016     Has had mild chronic anemia With previous iron deficiency.   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   Continues to take a beta blocker Does not follow with cardiologist       LABS:  Appointment on  11/09/2016  Component Date Value Ref Range Status  . Hgb A1c MFr Bld 11/09/2016 5.5  4.6 - 6.5 % Final   Glycemic Control Guidelines for People with Diabetes:Non Diabetic:  <6%Goal of Therapy: <7%Additional Action Suggested:  >8%   . Sodium 11/09/2016 140  135 - 145 mEq/L Final  . Potassium 11/09/2016 4.2  3.5 - 5.1 mEq/L Final  . Chloride 11/09/2016 104  96 - 112 mEq/L Final  . CO2 11/09/2016 31  19 - 32 mEq/L Final  . Glucose, Bld 11/09/2016 80  70 - 99 mg/dL Final  . BUN 16/02/9603 17  6 - 23 mg/dL Final  . Creatinine, Ser 11/09/2016 1.29  0.40 - 1.50 mg/dL Final  . Total Bilirubin 11/09/2016 0.7  0.2 - 1.2 mg/dL Final  . Alkaline Phosphatase 11/09/2016 67  39 - 117 U/L Final  . AST 11/09/2016 9  0 - 37 U/L Final  . ALT 11/09/2016 6  0 - 53 U/L Final  . Total Protein 11/09/2016 7.7  6.0 - 8.3 g/dL Final  . Albumin 54/01/8118 3.8  3.5 - 5.2 g/dL Final  . Calcium 14/78/2956 9.6  8.4 - 10.5 mg/dL Final  . GFR 21/30/8657 57.50* >60.00 mL/min Final     Examination:   BP 130/76   Pulse 64   Ht 5\' 10"  (1.778 m)   Wt 223 lb 6.4 oz (101.3 kg)   SpO2 95%   BMI 32.05 kg/m   Body mass index is 32.05 kg/m.   No  edema present    ASSESSMENT/ PLAN:    Diabetes type 2  See history of present illness for detailed discussion of his current management, blood sugar patterns and problems identified Blood glucose control appears Excellent with A1c 5.5 He does not do any readings after evening meals usually and none after breakfast Fasting readings a low-normal and last month had a glucose of 53 fasting also He is more active especially in summer and does not take any Novolog except at suppertime  He will reduce his Evaristo Bury to 16 units and try to check more readings  after various meals to help adjust his NovoLog May take extra 4-6 units if eating out and eating desserts occasionally     Hyperlipidemia: Has been treated with Crestor 10 mg with good control, needs follow-up    Hypertension:  Well controlled now       Patient Instructions  Tresiba 16 units    Eric Grant 11/14/2016, 8:48 AM

## 2016-11-14 ENCOUNTER — Other Ambulatory Visit: Payer: Self-pay

## 2016-11-14 ENCOUNTER — Ambulatory Visit (INDEPENDENT_AMBULATORY_CARE_PROVIDER_SITE_OTHER): Payer: Medicare HMO | Admitting: Endocrinology

## 2016-11-14 ENCOUNTER — Encounter: Payer: Self-pay | Admitting: Endocrinology

## 2016-11-14 VITALS — BP 130/76 | HR 64 | Ht 70.0 in | Wt 223.4 lb

## 2016-11-14 DIAGNOSIS — Z794 Long term (current) use of insulin: Secondary | ICD-10-CM | POA: Diagnosis not present

## 2016-11-14 DIAGNOSIS — E1165 Type 2 diabetes mellitus with hyperglycemia: Secondary | ICD-10-CM

## 2016-11-14 MED ORDER — INSULIN PEN NEEDLE 31G X 8 MM MISC: 3 refills | Status: DC

## 2016-11-14 MED ORDER — GLUCOSE BLOOD VI STRP: 2.0000 | ORAL_STRIP | Freq: Every day | 3 refills | Status: DC

## 2016-11-14 NOTE — Patient Instructions (Signed)
Tresiba 16 units

## 2016-11-23 ENCOUNTER — Telehealth: Payer: Self-pay

## 2016-11-23 MED ORDER — GLUCOSE BLOOD VI STRP: ORAL_STRIP | 2 refills | Status: DC

## 2016-11-23 MED ORDER — ACCU-CHEK AVIVA PLUS W/DEVICE KIT: PACK | 2 refills | Status: DC

## 2016-11-23 MED ORDER — ACCU-CHEK FASTCLIX LANCETS MISC: 2 refills | Status: DC

## 2016-11-23 NOTE — Telephone Encounter (Signed)
Patient approves the accu-check.

## 2016-11-23 NOTE — Telephone Encounter (Signed)
Refill submitted. 

## 2016-11-23 NOTE — Telephone Encounter (Signed)
I contacted the patient and left a voicemail advising the onetouch meter will not be covered my the insurance. Patient advised to call back if he is ok with changing to the preferred brand accu-check.

## 2016-11-23 NOTE — Addendum Note (Signed)
Addended by: Ann MakiBAILEY, MEGAN T on: 11/23/2016 01:13 PM   Modules accepted: Orders

## 2016-11-26 ENCOUNTER — Other Ambulatory Visit: Payer: Self-pay | Admitting: Endocrinology

## 2016-11-28 ENCOUNTER — Other Ambulatory Visit: Payer: Self-pay

## 2016-11-28 MED ORDER — ACCU-CHEK FASTCLIX LANCETS MISC: 2 refills | Status: DC

## 2016-11-28 MED ORDER — GLUCOSE BLOOD VI STRP: ORAL_STRIP | 2 refills | Status: DC

## 2016-11-28 MED ORDER — ACCU-CHEK AVIVA PLUS W/DEVICE KIT: PACK | 2 refills | Status: DC

## 2016-11-28 NOTE — Telephone Encounter (Signed)
Called patient and left a voice message to please clarify if all of the Accu-Chek products need to be sent to [facility]? The device, the test strips, and the lancets. I can call him back once he confirms which pharmacy that all of the supplies be sent to.

## 2016-11-28 NOTE — Telephone Encounter (Signed)
Patient confirms that all need to go to  [facility] [place] - [id number] [phone] (Phone) [phone] (Fax)

## 2016-11-28 NOTE — Telephone Encounter (Signed)
**  Remind patient they can make refill requests via MyChart**  Medication refill request (Name & Dosage):  ACCUVeterinary surgeon- CHECK  Preferred pharmacy (Name & Address):  [facility] - [place], [place] - [id number] [phone] (Phone) [phone] (Fax)   Other comments (if applicable):  The accu-check [facility] but, there is a charge when [facility] to [facility]; adjust pharmacy to the one above.  Patient requesting a call once this has been changed.

## 2016-11-28 NOTE — Telephone Encounter (Signed)
Called patient and let him know that I have sent in his meter, strips, and lancets to [facility] for him.

## 2016-12-03 ENCOUNTER — Telehealth: Payer: Self-pay | Admitting: Endocrinology

## 2016-12-03 ENCOUNTER — Other Ambulatory Visit: Payer: Self-pay

## 2016-12-03 MED ORDER — OLMESARTAN MEDOXOMIL-HCTZ 20-12.5 MG PO TABS: ORAL_TABLET | ORAL | 4 refills | Status: DC

## 2016-12-03 NOTE — Telephone Encounter (Signed)
This has been ordered 

## 2016-12-03 NOTE — Telephone Encounter (Signed)
Please change the prescription to Benicar HCT 20/12.5 mg daily

## 2016-12-03 NOTE — Telephone Encounter (Signed)
Patient's medication "Valsartan" was recalled. Needs a diff script as it was returned to pharmacy.  Thank you,  -LL

## 2016-12-03 NOTE — Telephone Encounter (Signed)
Please advise 

## 2016-12-05 NOTE — Telephone Encounter (Signed)
Patient has been notified. No further action required.  °

## 2017-01-03 ENCOUNTER — Other Ambulatory Visit: Payer: Self-pay | Admitting: Endocrinology

## 2017-02-11 ENCOUNTER — Other Ambulatory Visit: Payer: Medicare HMO

## 2017-02-14 ENCOUNTER — Ambulatory Visit: Payer: Medicare HMO | Admitting: Endocrinology

## 2017-02-14 ENCOUNTER — Other Ambulatory Visit: Payer: Self-pay | Admitting: Endocrinology

## 2017-03-05 ENCOUNTER — Other Ambulatory Visit: Payer: Self-pay | Admitting: Endocrinology

## 2017-03-21 ENCOUNTER — Other Ambulatory Visit (INDEPENDENT_AMBULATORY_CARE_PROVIDER_SITE_OTHER): Payer: Medicare HMO

## 2017-03-21 DIAGNOSIS — Z794 Long term (current) use of insulin: Secondary | ICD-10-CM | POA: Diagnosis not present

## 2017-03-21 DIAGNOSIS — E1165 Type 2 diabetes mellitus with hyperglycemia: Secondary | ICD-10-CM | POA: Diagnosis not present

## 2017-03-21 LAB — BASIC METABOLIC PANEL
BUN: 22 mg/dL (ref 6–23)
CHLORIDE: 101 meq/L (ref 96–112)
CO2: 32 mEq/L (ref 19–32)
CREATININE: 1.34 mg/dL (ref 0.40–1.50)
Calcium: 10.1 mg/dL (ref 8.4–10.5)
GFR: 54.98 mL/min — AB (ref >60.00)
Glucose, Bld: 79 mg/dL (ref 70–99)
POTASSIUM: 4.3 meq/L (ref 3.5–5.1)
Sodium: 139 mEq/L (ref 135–145)

## 2017-03-21 LAB — HEMOGLOBIN A1C: HEMOGLOBIN A1C: 5.7 % (ref 4.6–6.5)

## 2017-03-23 ENCOUNTER — Other Ambulatory Visit: Payer: Self-pay

## 2017-03-23 ENCOUNTER — Encounter (HOSPITAL_COMMUNITY): Payer: Self-pay | Admitting: Emergency Medicine

## 2017-03-23 ENCOUNTER — Emergency Department (HOSPITAL_COMMUNITY)
Admission: EM | Admit: 2017-03-23 | Discharge: 2017-03-23 | Disposition: A | Discharge status: Home / Self Care | Payer: Medicare HMO | Attending: Emergency Medicine | Admitting: Emergency Medicine

## 2017-03-23 DIAGNOSIS — S0990XA Unspecified injury of head, initial encounter: Secondary | ICD-10-CM | POA: Diagnosis present

## 2017-03-23 DIAGNOSIS — Z7982 Long term (current) use of aspirin: Secondary | ICD-10-CM | POA: Insufficient documentation

## 2017-03-23 DIAGNOSIS — S29019A Strain of muscle and tendon of unspecified wall of thorax, initial encounter: Secondary | ICD-10-CM

## 2017-03-23 DIAGNOSIS — I2510 Atherosclerotic heart disease of native coronary artery without angina pectoris: Secondary | ICD-10-CM | POA: Insufficient documentation

## 2017-03-23 DIAGNOSIS — Y929 Unspecified place or not applicable: Secondary | ICD-10-CM | POA: Insufficient documentation

## 2017-03-23 DIAGNOSIS — S00531A Contusion of lip, initial encounter: Secondary | ICD-10-CM | POA: Insufficient documentation

## 2017-03-23 DIAGNOSIS — I10 Essential (primary) hypertension: Secondary | ICD-10-CM | POA: Insufficient documentation

## 2017-03-23 DIAGNOSIS — Y939 Activity, unspecified: Secondary | ICD-10-CM | POA: Insufficient documentation

## 2017-03-23 DIAGNOSIS — Z794 Long term (current) use of insulin: Secondary | ICD-10-CM | POA: Diagnosis not present

## 2017-03-23 DIAGNOSIS — E119 Type 2 diabetes mellitus without complications: Secondary | ICD-10-CM | POA: Diagnosis not present

## 2017-03-23 DIAGNOSIS — Z79899 Other long term (current) drug therapy: Secondary | ICD-10-CM | POA: Insufficient documentation

## 2017-03-23 DIAGNOSIS — Y999 Unspecified external cause status: Secondary | ICD-10-CM | POA: Diagnosis not present

## 2017-03-23 MED ORDER — ACETAMINOPHEN 500 MG PO TABS
1000.0000 mg | ORAL_TABLET | Freq: Once | ORAL | Status: AC
  Administered 2017-03-23: 1000 mg via ORAL
  Filled 2017-03-23: qty 2
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
## 2017-03-23 NOTE — Discharge Instructions (Signed)
It was our pleasure to provide your ER care today - we hope that you feel better.  Rest. Take acetaminophen and/or ibuprofen as need for pain.  Follow up with primary care doctor in 1 week if symptoms fail to improve/resolve - also follow up with them for your blood pressure as it is high today.  Return to ER if worse, new symptoms, new or severe pain, severe headache, other concern.
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
## 2017-03-25 ENCOUNTER — Encounter: Payer: Self-pay | Admitting: Endocrinology

## 2017-03-25 ENCOUNTER — Ambulatory Visit
Admission: RE | Admit: 2017-03-25 | Discharge: 2017-03-25 | Disposition: A | Discharge status: Home / Self Care | Payer: Medicare HMO | Source: Ambulatory Visit | Attending: Internal Medicine | Admitting: Internal Medicine

## 2017-03-25 ENCOUNTER — Other Ambulatory Visit: Payer: Self-pay | Admitting: Internal Medicine

## 2017-03-25 ENCOUNTER — Ambulatory Visit: Payer: Medicare HMO | Admitting: Endocrinology

## 2017-03-25 VITALS — BP 138/90 | HR 70 | Ht 70.0 in | Wt 221.8 lb

## 2017-03-25 DIAGNOSIS — E119 Type 2 diabetes mellitus without complications: Secondary | ICD-10-CM

## 2017-03-25 DIAGNOSIS — M549 Dorsalgia, unspecified: Secondary | ICD-10-CM

## 2017-03-25 NOTE — Patient Instructions (Signed)
Check blood sugars on waking up  4/7  Also check blood sugars about 2 hours after a meal and do this after different meals by rotation  Recommended blood sugar levels on waking up is 90-130 and about 2 hours after meal is 130-160  Please bring your blood sugar monitor to each visit, thank you  
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
## 2017-03-27 ENCOUNTER — Telehealth: Payer: Self-pay | Admitting: Endocrinology

## 2017-03-27 NOTE — Telephone Encounter (Signed)
Called patient and left a voice message to call back if he needs to.

## 2017-03-27 NOTE — Telephone Encounter (Signed)
Please call back the pt he has further questions about the meds

## 2017-04-11 ENCOUNTER — Other Ambulatory Visit: Payer: Self-pay | Admitting: Endocrinology

## 2017-05-13 ENCOUNTER — Other Ambulatory Visit: Payer: Self-pay | Admitting: Endocrinology

## 2017-07-10 ENCOUNTER — Other Ambulatory Visit: Payer: Self-pay | Admitting: Endocrinology

## 2017-07-30 ENCOUNTER — Other Ambulatory Visit: Payer: Self-pay | Admitting: Endocrinology

## 2017-08-20 ENCOUNTER — Other Ambulatory Visit: Payer: Self-pay | Admitting: Endocrinology

## 2017-09-19 ENCOUNTER — Other Ambulatory Visit: Payer: Medicare HMO

## 2017-09-23 ENCOUNTER — Ambulatory Visit: Payer: Medicare HMO | Admitting: Endocrinology

## 2017-10-01 ENCOUNTER — Other Ambulatory Visit: Payer: Self-pay

## 2017-10-01 MED ORDER — OLMESARTAN MEDOXOMIL-HCTZ 20-12.5 MG PO TABS: ORAL_TABLET | ORAL | 1 refills | Status: DC

## 2017-10-15 ENCOUNTER — Other Ambulatory Visit: Payer: Self-pay | Admitting: Endocrinology

## 2017-10-17 ENCOUNTER — Other Ambulatory Visit: Payer: Self-pay | Admitting: Endocrinology

## 2017-11-11 ENCOUNTER — Other Ambulatory Visit (INDEPENDENT_AMBULATORY_CARE_PROVIDER_SITE_OTHER): Payer: Medicare HMO

## 2017-11-11 DIAGNOSIS — E119 Type 2 diabetes mellitus without complications: Secondary | ICD-10-CM

## 2017-11-11 LAB — COMPREHENSIVE METABOLIC PANEL
ALK PHOS: 74 U/L (ref 39–117)
ALT: 13 U/L (ref 0–53)
AST: 15 U/L (ref 0–37)
Albumin: 3.9 g/dL (ref 3.5–5.2)
BUN: 29 mg/dL — ABNORMAL HIGH (ref 6–23)
CO2: 30 meq/L (ref 19–32)
Calcium: 9.8 mg/dL (ref 8.4–10.5)
Chloride: 104 mEq/L (ref 96–112)
Creatinine, Ser: 1.53 mg/dL — ABNORMAL HIGH (ref 0.40–1.50)
GFR: 56.99 mL/min — ABNORMAL LOW (ref >60.00)
GLUCOSE: 81 mg/dL (ref 70–99)
POTASSIUM: 4.5 meq/L (ref 3.5–5.1)
Sodium: 139 mEq/L (ref 135–145)
Total Bilirubin: 0.7 mg/dL (ref 0.2–1.2)
Total Protein: 7.7 g/dL (ref 6.0–8.3)

## 2017-11-11 LAB — HEMOGLOBIN A1C: HEMOGLOBIN A1C: 6 % (ref 4.6–6.5)

## 2017-11-13 ENCOUNTER — Encounter: Payer: Self-pay | Admitting: Endocrinology

## 2017-11-13 ENCOUNTER — Ambulatory Visit: Payer: Medicare HMO | Admitting: Endocrinology

## 2017-11-13 VITALS — BP 124/58 | HR 65 | Ht 70.0 in | Wt 214.4 lb

## 2017-11-13 DIAGNOSIS — E119 Type 2 diabetes mellitus without complications: Secondary | ICD-10-CM | POA: Diagnosis not present

## 2017-11-13 DIAGNOSIS — E782 Mixed hyperlipidemia: Secondary | ICD-10-CM

## 2017-11-13 LAB — MICROALBUMIN / CREATININE URINE RATIO
CREATININE, U: 117.2 mg/dL
MICROALB/CREAT RATIO: 1.4 mg/g (ref 0.0–30.0)
Microalb, Ur: 1.6 mg/dL (ref 0.0–1.9)

## 2017-11-13 NOTE — Progress Notes (Signed)
Patient ID: Eric Grant, male   DOB: 06/21/1940, 77 y.o.   MRN: [id number]   Reason for Appointment:  Diabetes and other problems  History of Present Illness    Type 2 DIABETES MELITUS, date of diagnosis:  1988    Previous history: He has been on basal bolus insulin regimen for several years with excellent control He takes mealtime insulin only at breakfast and lunch Also has been taking Actoplusmet with good results for several years and no side effects  Recent history:  Insulin regimen:   TRESIBA 16 units in morning NOVOLOG insulin insulin 4 units in morning and 10 at supper  His A1c again is good with upper normal level of 0.0, previous range 5.5-6.1   Oral hypoglycemic drugs: metformin 850 twice a day, Actos 15 mg daily  Current blood sugar patterns, management and problems identified:  He again is checking blood sugars only fasting and not after meals as directed  Even though he thinks his meter is relatively new again his meter is giving him occasional falsely high readings at times and he has to repeat the blood sugar to get an accurate reading  However on one occasion his lowest blood sugar was only 154 with 5 different tests,: 2 hours later his blood sugar was 75  He does not adjust his mealtime insulin based on what he is eating  Also is taking a little less NovoLog at suppertime even though he has not reported hypoglycemia after evening meal or knowing what his blood sugars are after eating  Weight is down 7 pounds since last year     Proper timing of medications in relation to meals: Yes.          Monitors blood glucose: Marland Kitchen    Glucometer:  Accu-Chek Aviva      Blood Glucose readings by download   Fasting blood sugar range 75-154, no readings done nonfasting                   Physical activity: exercise:  some gardening or walking    Weight control:    Wt Readings from Last 3 Encounters:  11/13/17 214 lb 6.4 oz (97.3 kg)  03/25/17 221 lb 12.8  oz (100.6 kg)  03/23/17 224 lb (101.6 kg)         Diabetes labs:  Lab Results  Component Value Date   HGBA1C 6.0 11/11/2017   HGBA1C 5.7 03/21/2017   HGBA1C 5.5 11/09/2016   Lab Results  Component Value Date   MICROALBUR 1.6 11/13/2017   LDLCALC 67 01/03/2016   CREATININE 1.53 (H) 11/11/2017    OTHER active problems: See review of systems   Allergies as of 11/13/2017   No Known Allergies     Medication List        Accurate as of 11/13/17 11:59 PM. Always use your most recent med list.          ACCU-CHEK AVIVA PLUS test strip Generic drug:  glucose blood USE TO CHECK BLOOD SUGAR FOUR TIMES DAILY   ACCU-CHEK AVIVA PLUS w/Device Kit USE  TO CHECK BLOOD SUGAR FOUR TIMES DAILY   ACCU-CHEK FASTCLIX LANCETS Misc USE  TO CHECK BLOOD SUGAR FOUR TIMES DAILY   Alcohol Swabs 70 % Pads Use as directed   allopurinol 100 MG tablet Commonly known as:  ZYLOPRIM Take 100 mg by mouth daily.   aspirin 81 MG tablet Take 81 mg by mouth daily.   carvedilol 12.5 MG tablet Commonly known  as:  COREG TAKE 1 TABLET TWICE DAILY WITH A MEAL   cholecalciferol 400 units Tabs tablet Commonly known as:  VITAMIN D Take 400 Units by mouth.   Insulin Pen Needle 31G X 8 MM Misc Commonly known as:  B-D ULTRAFINE III SHORT PEN USE THREE TIMES A DAY   metFORMIN 850 MG tablet Commonly known as:  GLUCOPHAGE TAKE 1 TABLET TWICE DAILY  WITH  MEALS   NOVOLOG FLEXPEN 100 UNIT/ML FlexPen Generic drug:  insulin aspart INJECT 4 UNITS SUBCUTANEOUSLY AT BREAKFAST AND 14 TO 15 UNITS  AT SUPPER   olmesartan-hydrochlorothiazide 20-12.5 MG tablet Commonly known as:  BENICAR HCT TAKE 1 TABLET BY MOUTH ONCE DAILY   pioglitazone 15 MG tablet Commonly known as:  ACTOS Take 1 tablet (15 mg total) by mouth daily.   pioglitazone 15 MG tablet Commonly known as:  ACTOS TAKE 1 TABLET BY MOUTH ONCE DAILY   rosuvastatin 20 MG tablet Commonly known as:  CRESTOR TAKE 1 TABLET EVERY DAY   TANDEM  162-115.2 MG Caps capsule Generic drug:  ferrous fumarate-iron polysaccharide complex TAKE 2 CAPSULES ONE TIME DAILY   TRESIBA FLEXTOUCH 100 UNIT/ML Sopn FlexTouch Pen Generic drug:  insulin degludec INJECT  18 UNITS SUBCUTANEOUSLY EVERY DAY  AT  10PM   valsartan-hydrochlorothiazide 320-12.5 MG tablet Commonly known as:  DIOVAN-HCT Take 0.5 tablets by mouth daily.       Allergies: No Known Allergies  Past Medical History:  Diagnosis Date  . Seizures ([place]) 1991  . Urinary tract infection 07/2012    History reviewed. No pertinent surgical history.  Family History  Problem Relation Age of Onset  . Hyperlipidemia Mother   . Diabetes Mother   . Hypertension Mother   . Diabetes Father   . Hypertension Father   . Cancer Neg Hx     Social History:  reports that he has never smoked. He has never used smokeless tobacco. His alcohol and drug histories are not on file.  Review of Systems:   Hypertension:  has had long-standing hypertension, Is on valsartan and carvedilol.  BP checked periodically at home, usually 762U systolic    BP Readings from Last 3 Encounters:  11/13/17 (!) 124/58  03/25/17 138/90  03/23/17 (!) 151/71   RENAL dysfunction: His creatinine is unusually high He says he has been taking meloxicam for several months, not clear if this is from his orthopedic doctor or PCP   Lab Results  Component Value Date   CREATININE 1.53 (H) 11/11/2017   CREATININE 1.34 03/21/2017   CREATININE 1.29 11/09/2016     Lipids: LDL is excellent  with his taking only half a tablet of Crestor 20 mg LDL done by PCP in August 2018 was 85  Lab Results  Component Value Date   CHOL 121 01/03/2016   HDL 36.80 (L) 01/03/2016   LDLCALC 67 01/03/2016   TRIG 84.0 01/03/2016   CHOLHDL 3 01/03/2016     Has had mild chronic anemia, with previous iron deficiency.   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   Continues to take a beta blocker  LABS:  Office Visit on 11/13/2017  Component Date Value Ref Range Status  . Microalb, Ur 11/13/2017 1.6  0.0 - 1.9 mg/dL Final  . Creatinine,U 11/13/2017 117.2  mg/dL Final  . Microalb Creat Ratio 11/13/2017 1.4  0.0 - 30.0 mg/g Final  Appointment on 11/11/2017  Component Date Value Ref Range Status  . Sodium 11/11/2017 139  135 - 145 mEq/L Final  . Potassium 11/11/2017 4.5  3.5 - 5.1 mEq/L Final  . Chloride 11/11/2017 104  96 - 112 mEq/L Final  . CO2 11/11/2017 30  19 - 32 mEq/L Final  . Glucose, Bld 11/11/2017 81  70 - 99 mg/dL Final  . BUN 11/11/2017 29* 6 - 23 mg/dL Final  . Creatinine, Ser 11/11/2017 1.53* 0.40 - 1.50 mg/dL Final  . Total Bilirubin 11/11/2017 0.7  0.2 - 1.2 mg/dL Final  . Alkaline Phosphatase 11/11/2017 74  39 - 117 U/L Final  . AST 11/11/2017 15  0 - 37 U/L Final  . ALT 11/11/2017 13  0 - 53 U/L Final  . Total Protein 11/11/2017 7.7  6.0 - 8.3 g/dL Final  . Albumin 11/11/2017 3.9  3.5 - 5.2 g/dL Final  . Calcium 11/11/2017 9.8  8.4 - 10.5 mg/dL Final  . GFR 11/11/2017 56.99* >60.00 mL/min Final  . Hgb A1c MFr Bld 11/11/2017 6.0  4.6 - 6.5 % Final   Glycemic Control Guidelines for People with Diabetes:Non Diabetic:  <6%Goal of Therapy: <7%Additional Action Suggested:  >8%      Examination:   BP (!) 124/58 (BP Location: Left Arm, Patient Position: Sitting, Cuff Size: Normal)   Pulse 65   Ht _0  (1.778 m)   Wt 214 lb 6.4 oz (97.3 kg)   SpO2 95%   BMI 30.76 kg/m   Body mass index is 30.76 kg/m.      ASSESSMENT/ PLAN:    Diabetes type 2  See history of present illness for detailed discussion of his current management, blood sugar patterns and  problems identified  Blood glucose control appears stable with A1c 6.0 and consistently normal  He has lost weight since last year, although he is not as active as used to be he is usually watching his diet fairly well now Most of his fasting blood sugars are fairly good with an occasional high reading Discussed that he should not arbitrarily take NovoLog insulin without checking postprandial readings which he is not doing Also consider using new glucose meter as he sometimes has falsely high readings, this may be from his not having his fingertips clean before testing and this was discussed    Hyperlipidemia: Has been treated with Crestor 10 mg and to be followed by PCP   RENAL dysfunction: Likely to be from meloxicam and he can stop this and switch to Tylenol, he will discuss further with orthopedic surgeon    Hypertension: Well controlled  Check microalbumin today      Patient Instructions  Stop Meloxicam  Check blood sugars on waking up 3/7   Also check blood sugars about 2 hours after a meal and do this after different meals by rotation  Recommended blood sugar levels on waking up is 90-130 and about 2 hours after meal is 130-160  Please bring your blood sugar monitor to each visit, thank you  [employer] Novolog dose based on 2 hr sugar level    Takeysha Bonk 11/14/2017, 7:59 AM

## 2017-11-13 NOTE — Patient Instructions (Addendum)
Stop Meloxicam  Check blood sugars on waking up 3/7   Also check blood sugars about 2 hours after a meal and do this after different meals by rotation  Recommended blood sugar levels on waking up is 90-130 and about 2 hours after meal is 130-160  Please bring your blood sugar monitor to each visit, thank you  [employer] Novolog dose based on 2 hr sugar level

## 2017-12-04 ENCOUNTER — Other Ambulatory Visit: Payer: Self-pay | Admitting: Endocrinology

## 2018-01-29 ENCOUNTER — Other Ambulatory Visit: Payer: Self-pay | Admitting: Endocrinology

## 2018-02-15 ENCOUNTER — Other Ambulatory Visit: Payer: Self-pay | Admitting: Endocrinology

## 2018-04-01 ENCOUNTER — Other Ambulatory Visit: Payer: Self-pay | Admitting: Endocrinology

## 2018-04-23 ENCOUNTER — Other Ambulatory Visit: Payer: Self-pay | Admitting: Endocrinology

## 2018-05-08 ENCOUNTER — Other Ambulatory Visit: Payer: Self-pay | Admitting: Endocrinology

## 2018-05-13 ENCOUNTER — Other Ambulatory Visit: Payer: Medicare HMO

## 2018-05-19 ENCOUNTER — Ambulatory Visit: Payer: Medicare HMO | Admitting: Endocrinology

## 2018-06-09 ENCOUNTER — Other Ambulatory Visit (INDEPENDENT_AMBULATORY_CARE_PROVIDER_SITE_OTHER): Payer: Medicare HMO

## 2018-06-09 DIAGNOSIS — E119 Type 2 diabetes mellitus without complications: Secondary | ICD-10-CM

## 2018-06-09 DIAGNOSIS — E782 Mixed hyperlipidemia: Secondary | ICD-10-CM

## 2018-06-09 LAB — COMPREHENSIVE METABOLIC PANEL
ALK PHOS: 71 U/L (ref 39–117)
ALT: 11 U/L (ref 0–53)
AST: 15 U/L (ref 0–37)
Albumin: 3.9 g/dL (ref 3.5–5.2)
BUN: 25 mg/dL — ABNORMAL HIGH (ref 6–23)
CO2: 28 meq/L (ref 19–32)
Calcium: 9.7 mg/dL (ref 8.4–10.5)
Chloride: 105 mEq/L (ref 96–112)
Creatinine, Ser: 1.45 mg/dL (ref 0.40–1.50)
GFR: 56.96 mL/min — AB (ref >60.00)
GLUCOSE: 82 mg/dL (ref 70–99)
POTASSIUM: 4.2 meq/L (ref 3.5–5.1)
Sodium: 139 mEq/L (ref 135–145)
TOTAL PROTEIN: 7.8 g/dL (ref 6.0–8.3)
Total Bilirubin: 0.7 mg/dL (ref 0.2–1.2)

## 2018-06-09 LAB — LIPID PANEL
Cholesterol: 100 mg/dL (ref 0–200)
HDL: 31.2 mg/dL — AB (ref >39.00)
LDL CALC: 54 mg/dL (ref 0–99)
NONHDL: 69.11
Total CHOL/HDL Ratio: 3
Triglycerides: 76 mg/dL (ref 0.0–149.0)
VLDL: 15.2 mg/dL (ref 0.0–40.0)

## 2018-06-09 LAB — HEMOGLOBIN A1C: Hgb A1c MFr Bld: 6.2 % (ref 4.6–6.5)

## 2018-06-11 NOTE — Progress Notes (Signed)
Patient ID: Eric Grant, male   DOB: [birth year], 78 y.o.   MRN: [id number]   Reason for Appointment:  Diabetes and other problems  History of Present Illness    Type 2 DIABETES MELITUS, date of diagnosis:  1988    Previous history: He has been on basal bolus insulin regimen for several years with excellent control He takes mealtime insulin only at breakfast and lunch Also has been taking Actoplusmet with good results for several years and no side effects  Recent history:  Insulin regimen:   TRESIBA 16 units in morning NOVOLOG insulin insulin 4 units in morning and 10 at supper  His A1c again is upper normal at 6.2, previous range 5.5-6.1   Oral hypoglycemic drugs: metformin 850 twice a day, Actos 15 mg daily  Current blood sugar patterns, management and problems identified:  He does not report any hypoglycemia even with his average blood sugar in the morning only 99  Although he has not been able to walk as much he thinks he is usually trying to watch his diet  Has gained back the weight he had lost last summer  Despite reminders he forgets to check his readings after meals and not clear if he is getting adequate coverage for his meals or even occasional hypoglycemia  No side effects with Actos or Metformin       Monitors blood glucose: Marland Kitchen    Glucometer:  Accu-Chek Aviva      Blood Glucose readings by download   Fasting blood sugar range 69-150 AVERAGE about 99 Has only 1 unusually high reading early morning of 144 His meter is programmed 4 hours behind                  Physical activity: exercise:  some yard work and walking, limited by knee pain   Weight control:    Wt Readings from Last 3 Encounters:  06/12/18 223 lb 6.4 oz (101.3 kg)  11/13/17 214 lb 6.4 oz (97.3 kg)  03/25/17 221 lb 12.8 oz (100.6 kg)         Diabetes labs:  Lab Results  Component Value Date   HGBA1C 6.2 06/09/2018   HGBA1C 6.0 11/11/2017   HGBA1C 5.7 03/21/2017   Lab  Results  Component Value Date   MICROALBUR 1.6 11/13/2017   LDLCALC 54 06/09/2018   CREATININE 1.45 06/09/2018    OTHER active problems: See review of systems   Allergies as of 06/12/2018   No Known Allergies     Medication List       Accurate as of June 12, 2018  8:04 AM. Always use your most recent med list.        ACCU-CHEK AVIVA PLUS test strip Generic drug:  glucose blood USE TO CHECK BLOOD SUGAR FOUR TIMES DAILY   ACCU-CHEK AVIVA PLUS w/Device Kit USE  TO CHECK BLOOD SUGAR FOUR TIMES DAILY   ACCU-CHEK FASTCLIX LANCETS Misc USE  TO CHECK BLOOD SUGAR FOUR TIMES DAILY   Alcohol Swabs 70 % Pads Use as directed   allopurinol 100 MG tablet Commonly known as:  ZYLOPRIM Take 100 mg by mouth daily.   aspirin 81 MG tablet Take 81 mg by mouth daily.   carvedilol 12.5 MG tablet Commonly known as:  COREG TAKE 1 TABLET TWICE DAILY WITH A MEAL   cholecalciferol 10 MCG (400 UNIT) Tabs tablet Commonly known as:  VITAMIN D3 Take 400 Units by mouth.   Insulin Pen Needle 31G X 8 MM  Misc Commonly known as:  B-D ULTRAFINE III SHORT PEN USE THREE TIMES A DAY   metFORMIN 850 MG tablet Commonly known as:  GLUCOPHAGE TAKE 1 TABLET TWICE DAILY  WITH  MEALS   NOVOLOG FLEXPEN 100 UNIT/ML FlexPen Generic drug:  insulin aspart INJECT 4 UNITS SUBCUTANEOUSLY AT BREAKFAST AND 14 TO 15 UNITS  AT SUPPER   olmesartan-hydrochlorothiazide 20-12.5 MG tablet Commonly known as:  BENICAR HCT TAKE 1 TABLET BY MOUTH ONCE DAILY   pioglitazone 15 MG tablet Commonly known as:  ACTOS Take 1 tablet (15 mg total) by mouth daily.   rosuvastatin 20 MG tablet Commonly known as:  CRESTOR TAKE 1 TABLET EVERY DAY   TANDEM 162-115.2 MG Caps capsule Generic drug:  ferrous fumarate-iron polysaccharide complex TAKE 2 CAPSULES ONE TIME DAILY   TRESIBA FLEXTOUCH 100 UNIT/ML Sopn FlexTouch Pen Generic drug:  insulin degludec INJECT  18 UNITS SUBCUTANEOUSLY EVERY DAY  AT  10PM        Allergies: No Known Allergies  Past Medical History:  Diagnosis Date  . Seizures ([place]) 1991  . Urinary tract infection 07/2012    History reviewed. No pertinent surgical history.  Family History  Problem Relation Age of Onset  . Hyperlipidemia Mother   . Diabetes Mother   . Hypertension Mother   . Diabetes Father   . Hypertension Father   . Cancer Neg Hx     Social History:  reports that he has never smoked. He has never used smokeless tobacco. No history on file for alcohol and drug.  Review of Systems:   Hypertension:  has had long-standing hypertension, Is on Benicar HCT and this was started after he could not get valsartan. Also on carvedilol.  BP checked periodically at home, usually 417-408X systolic   BP Readings from Last 3 Encounters:  06/12/18 130/70  11/13/17 (!) 124/58  03/25/17 138/90   RENAL dysfunction: His creatinine is better after stopping meloxicam on the last visit    Lab Results  Component Value Date   CREATININE 1.45 06/09/2018   CREATININE 1.53 (H) 11/11/2017   CREATININE 1.34 03/21/2017     Lipids: LDL is excellent  with his taking only half a tablet of Crestor 20 mg   Lab Results  Component Value Date   CHOL 100 06/09/2018   HDL 31.20 (L) 06/09/2018   LDLCALC 54 06/09/2018   TRIG 76.0 06/09/2018   CHOLHDL 3 06/09/2018     Has had mild chronic anemia, taking long-term iron Now followed by PCP He was supposed to have COLONOSCOPY in 2019 but he has not followed up regarding this  Lab Results  Component Value Date   WBC 6.5 07/10/2016   HGB 13.3 07/10/2016   HCT 41.7 07/10/2016   MCV 89.1 07/10/2016   PLT 234.0 07/10/2016     CARDIAC history:  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 Cholesterol 06/09/2018 100  0 - 200 mg/dL  Final   ATP III Classification       Desirable:  < 200 mg/dL               Borderline High:  200 - 239 mg/dL          High:  > = 240 mg/dL  . Triglycerides 06/09/2018 76.0  0.0 - 149.0 mg/dL Final  Normal:  <150 mg/dLBorderline High:  150 - 199 mg/dL  . HDL 06/09/2018 31.20* >39.00 mg/dL Final  . VLDL 06/09/2018 15.2  0.0 - 40.0 mg/dL Final  . LDL Cholesterol 06/09/2018 54  0 - 99 mg/dL Final  . Total CHOL/HDL Ratio 06/09/2018 3   Final                  Men          Women1/2 Average Risk     3.4          3.3Average Risk          5.0          4.42X Average Risk          9.6          7.13X Average Risk          15.0          11.0                      . NonHDL 06/09/2018 69.11   Final   NOTE:  Non-HDL goal should be 30 mg/dL higher than patient's LDL goal (i.e. LDL goal of < 70 mg/dL, would have non-HDL goal of < 100 mg/dL)  . Sodium 06/09/2018 139  135 - 145 mEq/L Final  . Potassium 06/09/2018 4.2  3.5 - 5.1 mEq/L Final  . Chloride 06/09/2018 105  96 - 112 mEq/L Final  . CO2 06/09/2018 28  19 - 32 mEq/L Final  . Glucose, Bld 06/09/2018 82  70 - 99 mg/dL Final  . BUN 06/09/2018 25* 6 - 23 mg/dL Final  . Creatinine, Ser 06/09/2018 1.45  0.40 - 1.50 mg/dL Final  . Total Bilirubin 06/09/2018 0.7  0.2 - 1.2 mg/dL Final  . Alkaline Phosphatase 06/09/2018 71  39 - 117 U/L Final  . AST 06/09/2018 15  0 - 37 U/L Final  . ALT 06/09/2018 11  0 - 53 U/L Final  . Total Protein 06/09/2018 7.8  6.0 - 8.3 g/dL Final  . Albumin 06/09/2018 3.9  3.5 - 5.2 g/dL Final  . Calcium 06/09/2018 9.7  8.4 - 10.5 mg/dL Final  . GFR 06/09/2018 56.96* >60.00 mL/min Final  . Hgb A1c MFr Bld 06/09/2018 6.2  4.6 - 6.5 % Final   Glycemic Control Guidelines for People with Diabetes:Non Diabetic:  <6%Goal of Therapy: <7%Additional Action Suggested:  >8%      Examination:   BP 130/70 (BP Location: Left Arm, Patient Position: Sitting, Cuff Size: Normal)   Pulse 76   Ht _0  (1.778 m)   Wt 223 lb 6.4 oz (101.3 kg)    SpO2 96%   BMI 32.05 kg/m   Body mass index is 32.05 kg/m.      ASSESSMENT/ PLAN:    Diabetes type 2 on insulin See history of present illness for detailed discussion of his current management, blood sugar patterns and problems identified  Blood glucose control is stable with A1c 6.2   He is taking low-dose insulin again No hypoglycemia reported although lowest reading 69 His blood sugars are not being monitored after meals Appears to have gained weight this winter Currently not exercising as much because of knee pain Advised him to check some readings after meals and not just in the morning also may need to reduce [place] Aas if he is having more frequent low normal readings    Hyperlipidemia: Has been treated with Crestor 10 mg with excellent  control   RENAL dysfunction: Improved with stopping meloxicam  Chronic knee pain: He can try OTC turmeric  Recommended that he follow-up with gastroenterologist regarding need for colonoscopy  Hypertension: Well controlled and he monitors at home also   Follow-up in 6 months  There are no Patient Instructions on file for this visit.   Elayne Snare 06/12/2018, 8:04 AM

## 2018-06-12 ENCOUNTER — Encounter: Payer: Self-pay | Admitting: Endocrinology

## 2018-06-12 ENCOUNTER — Ambulatory Visit (INDEPENDENT_AMBULATORY_CARE_PROVIDER_SITE_OTHER): Payer: Medicare HMO | Admitting: Endocrinology

## 2018-06-12 VITALS — BP 130/70 | HR 76 | Ht 70.0 in | Wt 223.4 lb

## 2018-06-12 DIAGNOSIS — I10 Essential (primary) hypertension: Secondary | ICD-10-CM

## 2018-06-12 DIAGNOSIS — E119 Type 2 diabetes mellitus without complications: Secondary | ICD-10-CM

## 2018-06-12 NOTE — Patient Instructions (Addendum)
Check blood sugars on waking up 2-3 days a week  Also check blood sugars about 2 hours after meals and do this after different meals by rotation  Recommended blood sugar levels on waking up are 90-130 and about 2 hours after meal is 130-160  Please bring your blood sugar monitor to each visit, thank you   

## 2018-07-22 ENCOUNTER — Other Ambulatory Visit: Payer: Self-pay | Admitting: Endocrinology

## 2018-08-25 ENCOUNTER — Other Ambulatory Visit: Payer: Self-pay | Admitting: Endocrinology

## 2018-09-13 ENCOUNTER — Other Ambulatory Visit: Payer: Self-pay | Admitting: Endocrinology

## 2018-09-15 ENCOUNTER — Other Ambulatory Visit: Payer: Self-pay | Admitting: Endocrinology

## 2018-09-16 ENCOUNTER — Other Ambulatory Visit: Payer: Self-pay

## 2018-09-16 MED ORDER — ACCU-CHEK AVIVA PLUS W/DEVICE KIT: PACK | 0 refills | Status: DC

## 2018-09-16 MED ORDER — ACCU-CHEK AVIVA VI SOLN: 0 refills | Status: AC

## 2018-09-16 MED ORDER — INSULIN PEN NEEDLE 31G X 8 MM MISC: 2 refills | Status: DC

## 2018-09-16 MED ORDER — GLUCOSE BLOOD VI STRP: ORAL_STRIP | 2 refills | Status: DC

## 2018-09-16 MED ORDER — ACCU-CHEK FASTCLIX LANCETS MISC: 2 refills | Status: AC

## 2018-09-16 MED ORDER — ALCOHOL SWABS 70 % PADS: MEDICATED_PAD | 1 refills | Status: DC

## 2018-11-26 ENCOUNTER — Other Ambulatory Visit: Payer: Self-pay | Admitting: Endocrinology

## 2018-12-08 ENCOUNTER — Other Ambulatory Visit (INDEPENDENT_AMBULATORY_CARE_PROVIDER_SITE_OTHER): Payer: Medicare HMO

## 2018-12-08 ENCOUNTER — Other Ambulatory Visit: Payer: Self-pay

## 2018-12-08 DIAGNOSIS — E119 Type 2 diabetes mellitus without complications: Secondary | ICD-10-CM

## 2018-12-08 LAB — HEMOGLOBIN A1C: Hgb A1c MFr Bld: 5.7 % (ref 4.6–6.5)

## 2018-12-08 LAB — COMPREHENSIVE METABOLIC PANEL
ALT: 9 U/L (ref 0–53)
AST: 14 U/L (ref 0–37)
Albumin: 4.1 g/dL (ref 3.5–5.2)
Alkaline Phosphatase: 71 U/L (ref 39–117)
BUN: 27 mg/dL — ABNORMAL HIGH (ref 6–23)
CO2: 25 mEq/L (ref 19–32)
Calcium: 9.9 mg/dL (ref 8.4–10.5)
Chloride: 106 mEq/L (ref 96–112)
Creatinine, Ser: 1.48 mg/dL (ref 0.40–1.50)
GFR: 55.56 mL/min — ABNORMAL LOW (ref >60.00)
Glucose, Bld: 99 mg/dL (ref 70–99)
Potassium: 4.4 mEq/L (ref 3.5–5.1)
Sodium: 141 mEq/L (ref 135–145)
Total Bilirubin: 0.6 mg/dL (ref 0.2–1.2)
Total Protein: 8.1 g/dL (ref 6.0–8.3)

## 2018-12-10 NOTE — Progress Notes (Signed)
Patient ID: Eric Grant, male   DOB: [birth year], 78 y.o.   MRN: [id number]  Today's office visit was provided via telemedicine using video technique The patient was explained the limitations of evaluation and management by telemedicine and the availability of in person appointments.  The patient understood the limitations and agreed to proceed. Patient also understood that the telehealth visit is billable. . Location of the patient: Patient's home . Location of the provider: Physician office Only the patient and myself were participating in the encounter    Reason for Appointment:  Diabetes and other problems  History of Present Illness    Type 2 DIABETES MELITUS, date of diagnosis:  1988    Previous history: He has been on basal bolus insulin regimen for several years with excellent control He takes mealtime insulin only at breakfast and lunch Also has been taking Actoplusmet with good results for several years and no side effects  Recent history:  Insulin regimen:   TRESIBA 16 units in morning NOVOLOG insulin insulin 4 units in morning and 14 at supper  His A1c again is excellent and now 5.7 compared to 6.2   Oral hypoglycemic drugs: metformin 850 twice a day, Actos 15 mg daily  Current blood sugar patterns, management and problems identified:  He has no specific readings for his blood sugar meter to relate to because of the time being programmed incorrectly  As before he forgets to do his blood sugars after meals  However he says that occasionally he checks his sugars at 11 AM he may have a low reading of 55  He has not done any readings after dinner  Lab glucose was 99 fasting  Despite previously taking 10 units of NovoLog at suppertime he is not taking 14 because of the way the prescription was written previously  Also if his blood sugar is below 100 in the morning he will not take his NovoLog  He says he is trying to cut back on portions and lose  weight and has probably lost about 8 pounds  Most of his exercises from yardwork and mowing lawns frequently  No hypoglycemic symptoms       Monitors blood glucose: Marland Kitchen    Glucometer:  Accu-Chek Aviva      Blood Glucose readings by patient history  Range 95-115 fasting, no readings after meals, rarely 55 at 11 AM Averages not available  Previous readings:  Fasting blood sugar range 69-150 AVERAGE about 99                   Physical activity: exercise:  some yard work and walking, limited by knee pain   Weight control:    Wt Readings from Last 3 Encounters:  06/12/18 223 lb 6.4 oz (101.3 kg)  11/13/17 214 lb 6.4 oz (97.3 kg)  03/25/17 221 lb 12.8 oz (100.6 kg)         Diabetes labs:  Lab Results  Component Value Date   HGBA1C 5.7 12/08/2018   HGBA1C 6.2 06/09/2018   HGBA1C 6.0 11/11/2017   Lab Results  Component Value Date   MICROALBUR 1.6 11/13/2017   LDLCALC 54 06/09/2018   CREATININE 1.48 12/08/2018    OTHER active problems: See review of systems   Allergies as of 12/11/2018   No Known Allergies     Medication List       Accurate as of December 10, 2018  9:04 PM. If you have any questions, ask your nurse or doctor.  Accu-Chek Aviva Plus w/Device Kit USE  TO CHECK BLOOD SUGAR FOUR TIMES DAILY   Accu-Chek Aviva Soln Use as instructed.   Accu-Chek FastClix Lancets Misc USE  TO CHECK BLOOD SUGAR FOUR TIMES DAILY   Alcohol Swabs 70 % Pads Use as directed   allopurinol 100 MG tablet Commonly known as: ZYLOPRIM Take 100 mg by mouth daily.   aspirin 81 MG tablet Take 81 mg by mouth daily.   carvedilol 12.5 MG tablet Commonly known as: COREG TAKE 1 TABLET TWICE DAILY WITH A MEAL   cholecalciferol 10 MCG (400 UNIT) Tabs tablet Commonly known as: VITAMIN D3 Take 400 Units by mouth.   glucose blood test strip Commonly known as: Accu-Chek Aviva Plus USE TO CHECK BLOOD SUGAR FOUR TIMES DAILY   Insulin Pen Needle 31G X 8 MM Misc Commonly  known as: B-D ULTRAFINE III SHORT PEN USE THREE TIMES A DAY   metFORMIN 850 MG tablet Commonly known as: GLUCOPHAGE TAKE 1 TABLET TWICE DAILY  WITH  MEALS   NovoLOG FlexPen 100 UNIT/ML FlexPen Generic drug: insulin aspart INJECT 4 UNITS SUBCUTANEOUSLY AT BREAKFAST AND 14 TO 15 UNITS  AT SUPPER   olmesartan-hydrochlorothiazide 20-12.5 MG tablet Commonly known as: BENICAR HCT Take 1 tablet by mouth once daily   pioglitazone 15 MG tablet Commonly known as: ACTOS Take 1 tablet (15 mg total) by mouth daily.   rosuvastatin 20 MG tablet Commonly known as: CRESTOR TAKE 1 TABLET EVERY DAY   Tandem 162-115.2 MG Caps capsule Generic drug: ferrous fumarate-iron polysaccharide complex TAKE 2 CAPSULES ONE TIME DAILY   Tresiba FlexTouch 100 UNIT/ML Sopn FlexTouch Pen Generic drug: insulin degludec INJECT  18 UNITS SUBCUTANEOUSLY EVERY DAY  AT  10PM       Allergies: No Known Allergies  Past Medical History:  Diagnosis Date  . Seizures ([place]) 1991  . Urinary tract infection 07/2012    No past surgical history on file.  Family History  Problem Relation Age of Onset  . Hyperlipidemia Mother   . Diabetes Mother   . Hypertension Mother   . Diabetes Father   . Hypertension Father   . Cancer Neg Hx     Social History:  reports that he has never smoked. He has never used smokeless tobacco. No history on file for alcohol and drug.  Review of Systems:   Hypertension:  has had long-standing hypertension, Is on Benicar HCT and this was started after he could not get valsartan. Also on carvedilol 12.5 mg.  BP checked periodically at home, recently 112/78   BP Readings from Last 3 Encounters:  06/12/18 130/70  11/13/17 (!) 124/58  03/25/17 138/90   RENAL dysfunction: His creatinine levels are stable:    Lab Results  Component Value Date   CREATININE 1.48 12/08/2018   CREATININE 1.45 06/09/2018   CREATININE 1.53 (H) 11/11/2017     Lipids: LDL is excellent  with his  taking only half a tablet of Crestor 20 mg   Lab Results  Component Value Date   CHOL 100 06/09/2018   HDL 31.20 (L) 06/09/2018   LDLCALC 54 06/09/2018   TRIG 76.0 06/09/2018   CHOLHDL 3 06/09/2018     Has had mild chronic anemia, taking long-term iron This is followed by PCP He was supposed to have COLONOSCOPY in 2019    CARDIAC history:  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 which was asymptomatic and not associated with CAD Has been continued long-term on carvedilol   LABS:  Lab on 12/08/2018  Component Date Value Ref Range Status  . Sodium 12/08/2018 141  135 - 145 mEq/L Final  . Potassium 12/08/2018 4.4  3.5 - 5.1 mEq/L Final  . Chloride 12/08/2018 106  96 - 112 mEq/L Final  . CO2 12/08/2018 25  19 - 32 mEq/L Final  . Glucose, Bld 12/08/2018 99  70 - 99 mg/dL Final  . BUN 12/08/2018 27* 6 - 23 mg/dL Final  . Creatinine, Ser 12/08/2018 1.48  0.40 - 1.50 mg/dL Final  . Total Bilirubin 12/08/2018 0.6  0.2 - 1.2 mg/dL Final  . Alkaline Phosphatase 12/08/2018 71  39 - 117 U/L Final  . AST 12/08/2018 14  0 - 37 U/L Final  . ALT 12/08/2018 9  0 - 53 U/L Final  . Total Protein 12/08/2018 8.1  6.0 - 8.3 g/dL Final  . Albumin 12/08/2018 4.1  3.5 - 5.2 g/dL Final  . Calcium 12/08/2018 9.9  8.4 - 10.5 mg/dL Final  . GFR 12/08/2018 55.56* >60.00 mL/min Final  . Hgb A1c MFr Bld 12/08/2018 5.7  4.6 - 6.5 % Final   Glycemic Control Guidelines for People with Diabetes:Non Diabetic:  <6%Goal of Therapy: <7%Additional Action Suggested:  >8%      Examination:   There were no vitals taken for this visit.  There is no height or weight on file to calculate BMI.      ASSESSMENT/ PLAN:    Diabetes type 2 on insulin See history of present illness for detailed discussion of his current management, blood sugar patterns and problems identified  Blood glucose control is excellent again with A1c 5.7  He is taking low-dose insulin regimen with mealtime coverage mostly at suppertime   Blood sugar monitoring has been again inadequate with mostly readings in the morning He is again taking somewhat arbitrary insulin doses and not clear what his postprandial readings are Unlikely that he will need 14 units of NovoLog with dinner since this is a large dose compared to basal insulin  Since he has had occasional readings as low as 55 discussed that he may be able to leave off his NovoLog in the mornings he is planning to do a lot of physical activity Otherwise needs to adjust his suppertime dose based on his postprandial readings, in the meantime try taking only 10 units as previously No change in [place]     Hyperlipidemia: Has been treated with Crestor 10 mg and will need follow-up    RENAL dysfunction: Creatinine is borderline and will need to be followed, no evidence of proteinuria but this will need to be rechecked   Recommended that he follow-up with gastroenterologist regarding need for colonoscopy  Hypertension: Well controlled at home and he will keep a check on this  Recommend flu vaccine   Follow-up in 6 months  There are no Patient Instructions on file for this visit.   Elayne Snare 12/10/2018, 9:04 PM

## 2018-12-11 ENCOUNTER — Encounter: Payer: Self-pay | Admitting: Endocrinology

## 2018-12-11 ENCOUNTER — Other Ambulatory Visit: Payer: Self-pay

## 2018-12-11 ENCOUNTER — Ambulatory Visit (INDEPENDENT_AMBULATORY_CARE_PROVIDER_SITE_OTHER): Payer: Medicare HMO | Admitting: Endocrinology

## 2018-12-11 DIAGNOSIS — E782 Mixed hyperlipidemia: Secondary | ICD-10-CM

## 2018-12-11 DIAGNOSIS — E119 Type 2 diabetes mellitus without complications: Secondary | ICD-10-CM | POA: Diagnosis not present

## 2018-12-11 DIAGNOSIS — I10 Essential (primary) hypertension: Secondary | ICD-10-CM | POA: Diagnosis not present

## 2018-12-18 DIAGNOSIS — H52209 Unspecified astigmatism, unspecified eye: Secondary | ICD-10-CM | POA: Diagnosis not present

## 2018-12-18 DIAGNOSIS — H524 Presbyopia: Secondary | ICD-10-CM | POA: Diagnosis not present

## 2018-12-18 DIAGNOSIS — H5213 Myopia, bilateral: Secondary | ICD-10-CM | POA: Diagnosis not present

## 2018-12-22 ENCOUNTER — Other Ambulatory Visit: Payer: Self-pay | Admitting: Endocrinology

## 2018-12-29 DIAGNOSIS — M109 Gout, unspecified: Secondary | ICD-10-CM | POA: Diagnosis not present

## 2018-12-29 DIAGNOSIS — Z23 Encounter for immunization: Secondary | ICD-10-CM | POA: Diagnosis not present

## 2018-12-29 DIAGNOSIS — I10 Essential (primary) hypertension: Secondary | ICD-10-CM | POA: Diagnosis not present

## 2018-12-29 DIAGNOSIS — N183 Chronic kidney disease, stage 3 (moderate): Secondary | ICD-10-CM | POA: Diagnosis not present

## 2018-12-29 DIAGNOSIS — Z125 Encounter for screening for malignant neoplasm of prostate: Secondary | ICD-10-CM | POA: Diagnosis not present

## 2018-12-29 DIAGNOSIS — E1122 Type 2 diabetes mellitus with diabetic chronic kidney disease: Secondary | ICD-10-CM | POA: Diagnosis not present

## 2018-12-29 DIAGNOSIS — E7800 Pure hypercholesterolemia, unspecified: Secondary | ICD-10-CM | POA: Diagnosis not present

## 2018-12-29 DIAGNOSIS — Z794 Long term (current) use of insulin: Secondary | ICD-10-CM | POA: Diagnosis not present

## 2018-12-29 DIAGNOSIS — Z0000 Encounter for general adult medical examination without abnormal findings: Secondary | ICD-10-CM | POA: Diagnosis not present

## 2018-12-29 DIAGNOSIS — Z1211 Encounter for screening for malignant neoplasm of colon: Secondary | ICD-10-CM | POA: Diagnosis not present

## 2018-12-29 DIAGNOSIS — I2510 Atherosclerotic heart disease of native coronary artery without angina pectoris: Secondary | ICD-10-CM | POA: Diagnosis not present

## 2018-12-29 DIAGNOSIS — Z1389 Encounter for screening for other disorder: Secondary | ICD-10-CM | POA: Diagnosis not present

## 2019-02-19 ENCOUNTER — Other Ambulatory Visit: Payer: Self-pay | Admitting: Endocrinology

## 2019-02-23 ENCOUNTER — Other Ambulatory Visit: Payer: Self-pay | Admitting: Endocrinology

## 2019-03-06 ENCOUNTER — Other Ambulatory Visit: Payer: Self-pay | Admitting: Endocrinology

## 2019-03-06 ENCOUNTER — Other Ambulatory Visit: Payer: Self-pay

## 2019-03-06 MED ORDER — TRESIBA FLEXTOUCH 100 UNIT/ML ~~LOC~~ SOPN: 16.0000 [IU] | PEN_INJECTOR | Freq: Every day | SUBCUTANEOUS | 1 refills | Status: DC

## 2019-03-16 ENCOUNTER — Other Ambulatory Visit: Payer: Self-pay

## 2019-03-25 ENCOUNTER — Other Ambulatory Visit: Payer: Self-pay | Admitting: Endocrinology

## 2019-04-09 ENCOUNTER — Other Ambulatory Visit: Payer: Self-pay | Admitting: Endocrinology

## 2019-05-18 ENCOUNTER — Other Ambulatory Visit: Payer: Self-pay

## 2019-05-18 MED ORDER — NOVOLOG FLEXPEN 100 UNIT/ML ~~LOC~~ SOPN: PEN_INJECTOR | SUBCUTANEOUS | 1 refills | Status: DC

## 2019-05-21 ENCOUNTER — Other Ambulatory Visit: Payer: Self-pay

## 2019-05-21 MED ORDER — NOVOLOG FLEXPEN 100 UNIT/ML ~~LOC~~ SOPN: PEN_INJECTOR | SUBCUTANEOUS | 0 refills | Status: DC

## 2019-06-08 ENCOUNTER — Other Ambulatory Visit: Payer: Medicare Other

## 2019-06-11 ENCOUNTER — Ambulatory Visit: Payer: Medicare Other | Admitting: Endocrinology

## 2019-07-01 ENCOUNTER — Encounter: Payer: Self-pay | Admitting: Endocrinology

## 2019-07-01 ENCOUNTER — Ambulatory Visit (INDEPENDENT_AMBULATORY_CARE_PROVIDER_SITE_OTHER): Payer: Medicare HMO | Admitting: Endocrinology

## 2019-07-01 ENCOUNTER — Other Ambulatory Visit: Payer: Self-pay

## 2019-07-01 VITALS — BP 140/80 | HR 70 | Ht 70.0 in | Wt 222.0 lb

## 2019-07-01 DIAGNOSIS — E782 Mixed hyperlipidemia: Secondary | ICD-10-CM | POA: Diagnosis not present

## 2019-07-01 DIAGNOSIS — E790 Hyperuricemia without signs of inflammatory arthritis and tophaceous disease: Secondary | ICD-10-CM | POA: Diagnosis not present

## 2019-07-01 DIAGNOSIS — D508 Other iron deficiency anemias: Secondary | ICD-10-CM | POA: Diagnosis not present

## 2019-07-01 DIAGNOSIS — E119 Type 2 diabetes mellitus without complications: Secondary | ICD-10-CM | POA: Diagnosis not present

## 2019-07-01 DIAGNOSIS — I10 Essential (primary) hypertension: Secondary | ICD-10-CM | POA: Diagnosis not present

## 2019-07-01 LAB — POCT GLYCOSYLATED HEMOGLOBIN (HGB A1C): Hemoglobin A1C: 5.7 % — AB (ref 4.0–5.6)

## 2019-07-01 NOTE — Patient Instructions (Signed)
Tresiba 14 units daily

## 2019-07-01 NOTE — Progress Notes (Signed)
Patient ID: Eric Grant, male   DOB: Dec 23, 1940, 79 y.o.   MRN: [id number]     Reason for Appointment: Follow-up, 6-monthvisit  History of Present Illness    Type 2 DIABETES MELITUS, date of diagnosis:  1988    Previous history: He has been on basal bolus insulin regimen for several years with excellent control He takes mealtime insulin only at breakfast and lunch Also has been taking Actoplusmet with good results for several years and no side effects  Recent history:  Insulin regimen:   TRESIBA 16 units in morning NOVOLOG insulin insulin 4 units in morning and 14 at supper  His A1c again is excellent and now 5.7 and unchanged   Oral hypoglycemic drugs: metformin 850 twice a day, Actos 15 mg daily  Current blood sugar patterns, management and problems identified:  He has only few readings after dinner and mostly at bedtime and not checking readings after meals as directed  Again he has checked readings primarily fasting  He does not think he is symptomatic with hypoglycemia even when blood sugars are as low as 65 fasting  He was told to reduce his insulin in the evening if eating smaller meals and skip morning dose when he is exercising but he appears to be taking the same dose consistently  His weight is about the same as last year  He is generally exercising or doing yard work when the weather is good but very little during winter, not going to the gym now  Has been consistent with taking his insulin doses including NovoLog before meals  No side effects from Metformin or Actos       Monitors blood glucose: .Marland Kitchen   Glucometer:  Accu-Chek Aviva      Blood Glucose readings from patient download:  fasting blood sugar range 65-136, AVERAGE reading 92 BEDTIME range 75-97 with only 5 readings  PREVIOUS range 95-115 fasting, no readings after meals, was 55 at 11 AM                   Physical activity: exercise:  some yard work and walking, limited by knee pain    Weight control:    Wt Readings from Last 3 Encounters:  07/01/19 222 lb (100.7 kg)  06/12/18 223 lb 6.4 oz (101.3 kg)  11/13/17 214 lb 6.4 oz (97.3 kg)         Diabetes labs:  Lab Results  Component Value Date   HGBA1C 5.7 (A) 07/01/2019   HGBA1C 5.7 12/08/2018   HGBA1C 6.2 06/09/2018   Lab Results  Component Value Date   MICROALBUR 1.6 11/13/2017   LDLCALC 54 06/09/2018   CREATININE 1.48 12/08/2018    OTHER active problems: See review of systems   Allergies as of 07/01/2019   No Known Allergies     Medication List       Accurate as of July 01, 2019 11:59 PM. If you have any questions, ask your nurse or doctor.        Accu-Chek Aviva Plus w/Device Kit USE  TO CHECK BLOOD SUGAR FOUR TIMES DAILY   Accu-Chek Aviva Soln Use as instructed.   Accu-Chek FastClix Lancets Misc USE  TO CHECK BLOOD SUGAR FOUR TIMES DAILY   Alcohol Swabs 70 % Pads Use as directed   allopurinol 100 MG tablet Commonly known as: ZYLOPRIM Take 100 mg by mouth daily.   aspirin 81 MG tablet Take 81 mg by mouth daily.   carvedilol 12.5  MG tablet Commonly known as: COREG TAKE 1 TABLET TWICE DAILY WITH A MEAL   cholecalciferol 10 MCG (400 UNIT) Tabs tablet Commonly known as: VITAMIN D3 Take 400 Units by mouth.   glucose blood test strip Commonly known as: Accu-Chek Aviva Plus USE TO CHECK BLOOD SUGAR FOUR TIMES DAILY   Insulin Pen Needle 31G X 8 MM Misc Commonly known as: B-D ULTRAFINE III SHORT PEN USE THREE TIMES A DAY   metFORMIN 850 MG tablet Commonly known as: GLUCOPHAGE TAKE 1 TABLET TWICE DAILY WITH MEALS   NovoLOG FlexPen 100 UNIT/ML FlexPen Generic drug: insulin aspart INJECT 4 UNITS SUBCUTANEOUSLY AT BREAKFAST AND 14 TO 15 UNITS  AT SUPPER   olmesartan-hydrochlorothiazide 20-12.5 MG tablet Commonly known as: BENICAR HCT Take 1 tablet by mouth once daily   pioglitazone 15 MG tablet Commonly known as: ACTOS Take 1 tablet by mouth once daily    rosuvastatin 20 MG tablet Commonly known as: CRESTOR TAKE 1 TABLET EVERY DAY   Tandem 162-115.2 MG Caps capsule Generic drug: ferrous fumarate-iron polysaccharide complex TAKE 2 CAPSULES ONE TIME DAILY   Tresiba FlexTouch 100 UNIT/ML Sopn FlexTouch Pen Generic drug: insulin degludec Inject 0.16 mLs (16 Units total) into the skin daily.       Allergies: No Known Allergies  Past Medical History:  Diagnosis Date  . Seizures ([place]) 1991  . Urinary tract infection 07/2012    No past surgical history on file.  Family History  Problem Relation Age of Onset  . Hyperlipidemia Mother   . Diabetes Mother   . Hypertension Mother   . Diabetes Father   . Hypertension Father   . Cancer Neg Hx     Social History:  reports that he has never smoked. He has never used smokeless tobacco. No history on file for alcohol and drug.  Review of Systems:   Hypertension:  has had long-standing hypertension, Is on Benicar HCT and carvedilol Blood pressure tends to be slightly higher in the office  BP checked periodically at home, recently 128/70   BP Readings from Last 3 Encounters:  07/01/19 140/80  06/12/18 130/70  11/13/17 (!) 124/58   RENAL dysfunction: His creatinine levels are usually mildly increased or upper normal:    Lab Results  Component Value Date   CREATININE 1.48 12/08/2018   CREATININE 1.45 06/09/2018   CREATININE 1.53 (H) 11/11/2017     Lipids: LDL is excellent  with his taking only half a tablet of Crestor 20 mg, last LDL was 54 Not had any follow-up labs   Lab Results  Component Value Date   CHOL 100 06/09/2018   HDL 31.20 (L) 06/09/2018   LDLCALC 54 06/09/2018   TRIG 76.0 06/09/2018   CHOLHDL 3 06/09/2018     Has had mild chronic anemia, has been recommended iron long-term which has usually normalized his hemoglobin This is followed by PCP, no recent records available  Lab Results  Component Value Date   HGB 13.3 07/10/2016     CARDIAC  history:  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 Hemoglobin A1C 07/01/2019 5.7* 4.0 - 5.6 % Final     Examination:   BP 140/80   Pulse 70   Ht _0  (1.778 m)   Wt 222 lb (100.7 kg)   SpO2  98%   BMI 31.85 kg/m   Body mass index is 31.85 kg/m.     Diabetic Foot Exam - Simple   Simple Foot Form Diabetic Foot exam was performed with the following findings: Yes   Visual Inspection No deformities, no ulcerations, no other skin breakdown bilaterally: Yes Sensation Testing Intact to touch and monofilament testing bilaterally: Yes Pulse Check Posterior Tibialis and Dorsalis pulse intact bilaterally: Yes Comments     No pedal edema  ASSESSMENT/ PLAN:    Diabetes type 2 on insulin See history of present illness for detailed discussion of his current management, blood sugar patterns and problems identified  Blood glucose control is excellent again with A1c 5.7  He is taking basal bolus insulin regimen with mealtime coverage twice a day  His blood sugars in the mornings are averaging 92 and occasionally low normal Considering that he may be more active in the spring and summer he should reduce his Tyler Aas down to 14 units Also because of low normal readings at bedtime occasionally he needs to cut down the dose to only 7-8 units at dinnertime if eating low carbohydrate meals like solid and not 14 units Again reminded him of checking more sugars more often after meals As before recommended that he can leave off his morning NovoLog if he is planning to be active after breakfast such as going to the gym which he may be starting soon Follow-up in 6 months for diabetes also    Hyperlipidemia: Has been treated with Crestor 10 mg and will need follow-up labs today    RENAL  dysfunction: Creatinine has been borderline and will need labs today again   Hypertension: Well controlled, usually slightly higher in the office and he will continue the same regimen  History of anemia with mild iron deficiency: Needs follow-up labs  Foot exam shows no abnormality today  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

## 2019-07-02 ENCOUNTER — Encounter: Payer: Self-pay | Admitting: Endocrinology

## 2019-07-02 LAB — CBC WITH DIFFERENTIAL/PLATELET
Basophils Absolute: 0.1 10*3/uL (ref 0.0–0.1)
Basophils Relative: 2.2 % (ref 0.0–3.0)
Eosinophils Absolute: 0.2 10*3/uL (ref 0.0–0.7)
Eosinophils Relative: 3.8 % (ref 0.0–5.0)
HCT: 37.2 % — ABNORMAL LOW (ref 39.0–52.0)
Hemoglobin: 11.7 g/dL — ABNORMAL LOW (ref 13.0–17.0)
Lymphocytes Relative: 23.5 % (ref 12.0–46.0)
Lymphs Abs: 1.3 10*3/uL (ref 0.7–4.0)
MCHC: 31.4 g/dL (ref 30.0–36.0)
MCV: 88.4 fl (ref 78.0–100.0)
Monocytes Absolute: 0.4 10*3/uL (ref 0.1–1.0)
Monocytes Relative: 7.7 % (ref 3.0–12.0)
Neutro Abs: 3.4 10*3/uL (ref 1.4–7.7)
Neutrophils Relative %: 62.8 % (ref 43.0–77.0)
Platelets: 203 10*3/uL (ref 150.0–400.0)
RBC: 4.21 Mil/uL — ABNORMAL LOW (ref 4.22–5.81)
RDW: 14.5 % (ref 11.5–15.5)
WBC: 5.5 10*3/uL (ref 4.0–10.5)

## 2019-07-02 LAB — COMPREHENSIVE METABOLIC PANEL
ALT: 9 U/L (ref 0–53)
AST: 14 U/L (ref 0–37)
Albumin: 4.1 g/dL (ref 3.5–5.2)
Alkaline Phosphatase: 76 U/L (ref 39–117)
BUN: 22 mg/dL (ref 6–23)
CO2: 27 mEq/L (ref 19–32)
Calcium: 9.9 mg/dL (ref 8.4–10.5)
Chloride: 105 mEq/L (ref 96–112)
Creatinine, Ser: 1.54 mg/dL — ABNORMAL HIGH (ref 0.40–1.50)
GFR: 52.99 mL/min — ABNORMAL LOW (ref >60.00)
Glucose, Bld: 98 mg/dL (ref 70–99)
Potassium: 4.4 mEq/L (ref 3.5–5.1)
Sodium: 139 mEq/L (ref 135–145)
Total Bilirubin: 0.7 mg/dL (ref 0.2–1.2)
Total Protein: 8.2 g/dL (ref 6.0–8.3)

## 2019-07-02 LAB — LIPID PANEL
Cholesterol: 107 mg/dL (ref 0–200)
HDL: 32.8 mg/dL — ABNORMAL LOW (ref >39.00)
LDL Cholesterol: 55 mg/dL (ref 0–99)
NonHDL: 74.06
Total CHOL/HDL Ratio: 3
Triglycerides: 93 mg/dL (ref 0.0–149.0)
VLDL: 18.6 mg/dL (ref 0.0–40.0)

## 2019-07-02 LAB — IBC PANEL
Iron: 44 ug/dL (ref 42–165)
Saturation Ratios: 13 % — ABNORMAL LOW (ref 20.0–50.0)
Transferrin: 241 mg/dL (ref 212.0–360.0)

## 2019-07-02 LAB — URIC ACID: Uric Acid, Serum: 7 mg/dL (ref 4.0–7.8)

## 2019-07-05 NOTE — Progress Notes (Signed)
Not sure if the result note was routed to you, please call patient.  Also needs 85-month appointment with labs

## 2019-07-15 ENCOUNTER — Other Ambulatory Visit: Payer: Self-pay | Admitting: Endocrinology

## 2019-07-21 DIAGNOSIS — E7800 Pure hypercholesterolemia, unspecified: Secondary | ICD-10-CM | POA: Diagnosis not present

## 2019-07-21 DIAGNOSIS — N1831 Chronic kidney disease, stage 3a: Secondary | ICD-10-CM | POA: Diagnosis not present

## 2019-07-21 DIAGNOSIS — J309 Allergic rhinitis, unspecified: Secondary | ICD-10-CM | POA: Diagnosis not present

## 2019-07-21 DIAGNOSIS — Z23 Encounter for immunization: Secondary | ICD-10-CM | POA: Diagnosis not present

## 2019-07-21 DIAGNOSIS — I10 Essential (primary) hypertension: Secondary | ICD-10-CM | POA: Diagnosis not present

## 2019-07-21 DIAGNOSIS — M109 Gout, unspecified: Secondary | ICD-10-CM | POA: Diagnosis not present

## 2019-07-21 DIAGNOSIS — E1122 Type 2 diabetes mellitus with diabetic chronic kidney disease: Secondary | ICD-10-CM | POA: Diagnosis not present

## 2019-07-21 DIAGNOSIS — Z794 Long term (current) use of insulin: Secondary | ICD-10-CM | POA: Diagnosis not present

## 2019-08-27 ENCOUNTER — Other Ambulatory Visit: Payer: Self-pay

## 2019-08-27 MED ORDER — ROSUVASTATIN CALCIUM 20 MG PO TABS: 20.0000 mg | ORAL_TABLET | Freq: Every day | ORAL | 1 refills | Status: DC

## 2019-08-27 MED ORDER — TRESIBA FLEXTOUCH 100 UNIT/ML ~~LOC~~ SOPN: PEN_INJECTOR | SUBCUTANEOUS | 1 refills | Status: DC
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
## 2019-09-30 ENCOUNTER — Other Ambulatory Visit: Payer: Self-pay | Admitting: Endocrinology

## 2019-10-23 ENCOUNTER — Other Ambulatory Visit: Payer: Self-pay | Admitting: Endocrinology

## 2019-10-27 ENCOUNTER — Other Ambulatory Visit: Payer: Self-pay

## 2019-10-27 DIAGNOSIS — I10 Essential (primary) hypertension: Secondary | ICD-10-CM

## 2019-10-27 DIAGNOSIS — E119 Type 2 diabetes mellitus without complications: Secondary | ICD-10-CM

## 2019-10-27 MED ORDER — METFORMIN HCL 850 MG PO TABS: 850.0000 mg | ORAL_TABLET | Freq: Two times a day (BID) | ORAL | 0 refills | Status: DC

## 2019-10-27 MED ORDER — ALLOPURINOL 100 MG PO TABS: 100.0000 mg | ORAL_TABLET | Freq: Every day | ORAL | 0 refills | Status: DC

## 2019-11-02 ENCOUNTER — Other Ambulatory Visit: Payer: Self-pay

## 2019-11-02 DIAGNOSIS — I10 Essential (primary) hypertension: Secondary | ICD-10-CM

## 2019-11-02 MED ORDER — ALLOPURINOL 100 MG PO TABS: 100.0000 mg | ORAL_TABLET | Freq: Every day | ORAL | 0 refills | Status: DC

## 2019-11-04 ENCOUNTER — Other Ambulatory Visit: Payer: Self-pay

## 2019-12-07 ENCOUNTER — Other Ambulatory Visit: Payer: Self-pay | Admitting: Endocrinology

## 2019-12-08 ENCOUNTER — Other Ambulatory Visit: Payer: Self-pay | Admitting: Endocrinology

## 2019-12-08 DIAGNOSIS — I10 Essential (primary) hypertension: Secondary | ICD-10-CM

## 2020-01-04 DIAGNOSIS — H52209 Unspecified astigmatism, unspecified eye: Secondary | ICD-10-CM | POA: Diagnosis not present

## 2020-01-04 DIAGNOSIS — H5213 Myopia, bilateral: Secondary | ICD-10-CM | POA: Diagnosis not present

## 2020-01-04 DIAGNOSIS — H524 Presbyopia: Secondary | ICD-10-CM | POA: Diagnosis not present

## 2020-01-06 ENCOUNTER — Other Ambulatory Visit: Payer: Self-pay | Admitting: Endocrinology

## 2020-01-06 DIAGNOSIS — E119 Type 2 diabetes mellitus without complications: Secondary | ICD-10-CM

## 2020-01-08 ENCOUNTER — Other Ambulatory Visit: Payer: Self-pay | Admitting: *Deleted

## 2020-01-08 DIAGNOSIS — E119 Type 2 diabetes mellitus without complications: Secondary | ICD-10-CM

## 2020-01-08 MED ORDER — METFORMIN HCL 850 MG PO TABS: 850.0000 mg | ORAL_TABLET | Freq: Two times a day (BID) | ORAL | 0 refills | Status: DC

## 2020-01-08 MED ORDER — TRUEPLUS LANCETS 28G MISC: 1 refills | Status: DC

## 2020-01-08 MED ORDER — BD PEN NEEDLE SHORT U/F 31G X 8 MM MISC: 0 refills | Status: DC

## 2020-01-08 MED ORDER — ALCOHOL SWABS 70 % PADS: MEDICATED_PAD | 1 refills | Status: AC

## 2020-01-08 MED ORDER — TRUE METRIX LEVEL 1 LOW VI SOLN: 0 refills | Status: DC

## 2020-01-13 ENCOUNTER — Encounter: Payer: Medicare HMO | Admitting: Endocrinology

## 2020-01-13 ENCOUNTER — Other Ambulatory Visit (INDEPENDENT_AMBULATORY_CARE_PROVIDER_SITE_OTHER): Payer: Medicare HMO

## 2020-01-13 ENCOUNTER — Other Ambulatory Visit: Payer: Self-pay

## 2020-01-13 DIAGNOSIS — E782 Mixed hyperlipidemia: Secondary | ICD-10-CM

## 2020-01-13 DIAGNOSIS — E790 Hyperuricemia without signs of inflammatory arthritis and tophaceous disease: Secondary | ICD-10-CM

## 2020-01-13 DIAGNOSIS — E119 Type 2 diabetes mellitus without complications: Secondary | ICD-10-CM

## 2020-01-13 LAB — COMPREHENSIVE METABOLIC PANEL
ALT: 9 U/L (ref 0–53)
AST: 14 U/L (ref 0–37)
Albumin: 4.2 g/dL (ref 3.5–5.2)
Alkaline Phosphatase: 77 U/L (ref 39–117)
BUN: 26 mg/dL — ABNORMAL HIGH (ref 6–23)
CO2: 26 mEq/L (ref 19–32)
Calcium: 9.6 mg/dL (ref 8.4–10.5)
Chloride: 106 mEq/L (ref 96–112)
Creatinine, Ser: 1.51 mg/dL — ABNORMAL HIGH (ref 0.40–1.50)
GFR: 54.13 mL/min — ABNORMAL LOW (ref >60.00)
Glucose, Bld: 80 mg/dL (ref 70–99)
Potassium: 4 mEq/L (ref 3.5–5.1)
Sodium: 139 mEq/L (ref 135–145)
Total Bilirubin: 0.7 mg/dL (ref 0.2–1.2)
Total Protein: 8 g/dL (ref 6.0–8.3)

## 2020-01-13 LAB — LIPID PANEL
Cholesterol: 102 mg/dL (ref 0–200)
HDL: 35.7 mg/dL — ABNORMAL LOW (ref >39.00)
LDL Cholesterol: 52 mg/dL (ref 0–99)
NonHDL: 66.43
Total CHOL/HDL Ratio: 3
Triglycerides: 74 mg/dL (ref 0.0–149.0)
VLDL: 14.8 mg/dL (ref 0.0–40.0)

## 2020-01-13 LAB — MICROALBUMIN / CREATININE URINE RATIO
Creatinine,U: 123.8 mg/dL
Microalb Creat Ratio: 1.1 mg/g (ref 0.0–30.0)
Microalb, Ur: 1.3 mg/dL (ref 0.0–1.9)

## 2020-01-13 LAB — HEMOGLOBIN A1C: Hgb A1c MFr Bld: 5.7 % (ref 4.6–6.5)

## 2020-01-13 LAB — URIC ACID: Uric Acid, Serum: 7 mg/dL (ref 4.0–7.8)

## 2020-01-13 NOTE — Progress Notes (Signed)
This encounter was created in error - please disregard.

## 2020-01-20 ENCOUNTER — Other Ambulatory Visit: Payer: Self-pay | Admitting: Endocrinology

## 2020-01-20 ENCOUNTER — Telehealth: Payer: Self-pay | Admitting: Endocrinology

## 2020-01-20 NOTE — Telephone Encounter (Signed)
Medication Refill Request  Did you call your pharmacy and request this refill first? Yes  . If patient has not contacted pharmacy first, instruct them to do so for future refills.  . Remind them that contacting the pharmacy for their refill is the quickest method to get the refill.  . Refill policy also stated that it will take anywhere between 24-72 hours to receive the refill.    Name of medication? Metformin and B-D pen needles (needs these called into [facility] instead of [facility]  Is this a 90 day supply? yes  Name and location of pharmacy?  [facility] - [place], [place] - [address] Phone:  [phone]  Fax:  [phone]

## 2020-01-21 ENCOUNTER — Other Ambulatory Visit: Payer: Self-pay

## 2020-01-21 DIAGNOSIS — E119 Type 2 diabetes mellitus without complications: Secondary | ICD-10-CM

## 2020-01-21 MED ORDER — BD PEN NEEDLE SHORT U/F 31G X 8 MM MISC: 1.0000 | Freq: Three times a day (TID) | 2 refills | Status: DC

## 2020-01-21 MED ORDER — METFORMIN HCL 850 MG PO TABS: 850.0000 mg | ORAL_TABLET | Freq: Two times a day (BID) | ORAL | 2 refills | Status: DC

## 2020-01-21 NOTE — Telephone Encounter (Signed)
E-Prescribing Status: Receipt confirmed by pharmacy (01/21/2020  9:07 AM EDT)

## 2020-01-25 ENCOUNTER — Other Ambulatory Visit: Payer: Self-pay | Admitting: Endocrinology

## 2020-01-27 ENCOUNTER — Other Ambulatory Visit: Payer: Self-pay

## 2020-01-27 ENCOUNTER — Ambulatory Visit (INDEPENDENT_AMBULATORY_CARE_PROVIDER_SITE_OTHER): Payer: Medicare HMO | Admitting: Endocrinology

## 2020-01-27 VITALS — BP 116/64 | HR 78 | Ht 70.0 in | Wt 218.0 lb

## 2020-01-27 DIAGNOSIS — E790 Hyperuricemia without signs of inflammatory arthritis and tophaceous disease: Secondary | ICD-10-CM | POA: Diagnosis not present

## 2020-01-27 DIAGNOSIS — E782 Mixed hyperlipidemia: Secondary | ICD-10-CM

## 2020-01-27 DIAGNOSIS — E119 Type 2 diabetes mellitus without complications: Secondary | ICD-10-CM | POA: Diagnosis not present

## 2020-01-27 DIAGNOSIS — I10 Essential (primary) hypertension: Secondary | ICD-10-CM | POA: Diagnosis not present

## 2020-01-27 DIAGNOSIS — N289 Disorder of kidney and ureter, unspecified: Secondary | ICD-10-CM

## 2020-01-27 MED ORDER — OLMESARTAN MEDOXOMIL 20 MG PO TABS: 20.0000 mg | ORAL_TABLET | Freq: Every day | ORAL | 1 refills | Status: DC

## 2020-01-27 NOTE — Progress Notes (Signed)
Patient ID: Eric Grant, male   DOB: December 27, 1940, 79 y.o.   MRN: [id number]     Reason for Appointment: Follow-up, 22-monthvisit  History of Present Illness    Type 2 DIABETES MELITUS, date of diagnosis:  1988    Previous history: He has been on basal bolus insulin regimen for several years with excellent control He takes mealtime insulin only at breakfast and lunch Also has been taking Actoplusmet with good results for several years and no side effects  Recent history:  Insulin regimen:   TRESIBA 14 units in morning NOVOLOG insulin insulin 4 units in morning and 14 at supper  His A1c again is excellent and now 5.7 and unchanged   Oral hypoglycemic drugs: metformin 850 twice a day, Actos 15 mg daily  Current blood sugar patterns, management and problems identified:  He was told to reduce his TTyler Aasand he has cut down 2 units, previously was having blood sugars as low as 65 fasting  Again his fasting blood sugars appear to be low normal and averaging 8088  However the last 4 days his blood sugars have been between 93 and 137  He does not appear to be adjusting his NOVOLOG based on what he is eating and will take about the same insulin whether he is eating salads or a high carbohydrate meal  With this his blood sugars at night have ranged between 46-247  He is trying to be active mostly with outside activities rather than formal exercise or walking  He has been regular with his Metformin and Actos  His weight is slightly lower  Generally eating healthy meals  Has only few readings after meals and his readings at night are mostly about 4 hours after eating       Monitors blood glucose: .Marland Kitchen   Glucometer:  Accu-Chek Aviva      Blood Glucose readings from patient download:   PRE-MEAL Fasting Lunch Dinner Bedtime Overall  Glucose range:  59-137      Mean/median:  88    130  102   POST-MEAL PC Breakfast PC Lunch PC Dinner  Glucose range:  ? ?   Mean/median:       PREVIOUSLY fasting blood sugar range 65-136, AVERAGE reading 92 BEDTIME range 75-97 with only 5 readings                   Physical activity: exercise:  some yard work and walking, limited by knee pain   Weight control:    Wt Readings from Last 3 Encounters:  01/27/20 218 lb (98.9 kg)  07/01/19 222 lb (100.7 kg)  06/12/18 223 lb 6.4 oz (101.3 kg)         Diabetes labs:  Lab Results  Component Value Date   HGBA1C 5.7 01/13/2020   HGBA1C 5.7 (A) 07/01/2019   HGBA1C 5.7 12/08/2018   Lab Results  Component Value Date   MICROALBUR 1.3 01/13/2020   LDLCALC 52 01/13/2020   CREATININE 1.51 (H) 01/13/2020    OTHER active problems: See review of systems   Allergies as of 01/27/2020   No Known Allergies     Medication List       Accurate as of January 27, 2020 11:59 PM. If you have any questions, ask your nurse or doctor.        STOP taking these medications   olmesartan-hydrochlorothiazide 20-12.5 MG tablet Commonly known as: BENICAR HCT Stopped by: AElayne Snare MD     TAKE  these medications   Accu-Chek Aviva Plus w/Device Kit USE  TO CHECK BLOOD SUGAR FOUR TIMES DAILY   Accu-Chek Aviva Soln Use as instructed.   True Metrix Level 1 Low Soln Use to calibrate glucometer   Accu-Chek FastClix Lancets Misc USE  TO CHECK BLOOD SUGAR FOUR TIMES DAILY   TRUEplus Lancets 28G Misc Use to check blood sugars four times a day   Alcohol Swabs 70 % Pads Use as directed   allopurinol 100 MG tablet Commonly known as: ZYLOPRIM Take 1 tablet (100 mg total) by mouth daily. OVERDUE FOR AN APPT. WILL PROVIDE 30 DAY SUPPLY. MUST CALL OFFICE   aspirin 81 MG tablet Take 81 mg by mouth daily.   B-D ULTRAFINE III SHORT PEN 31G X 8 MM Misc Generic drug: Insulin Pen Needle 1 each by Other route 3 (three) times daily. E11.9   carvedilol 12.5 MG tablet Commonly known as: COREG TAKE 1 TABLET TWICE DAILY WITH A MEAL   cholecalciferol 10 MCG (400 UNIT)  Tabs tablet Commonly known as: VITAMIN D3 Take 400 Units by mouth.   glucose blood test strip Commonly known as: Accu-Chek Aviva Plus USE TO CHECK BLOOD SUGAR FOUR TIMES DAILY   metFORMIN 850 MG tablet Commonly known as: GLUCOPHAGE Take 1 tablet (850 mg total) by mouth 2 (two) times daily with a meal.   NovoLOG FlexPen 100 UNIT/ML FlexPen Generic drug: insulin aspart INJECT 4 UNITS SUBCUTANEOUSLY AT BREAKFAST AND 14 TO 15 UNITS  AT SUPPER   olmesartan 20 MG tablet Commonly known as: BENICAR Take 1 tablet (20 mg total) by mouth daily. Started by: Elayne Snare, MD   pioglitazone 15 MG tablet Commonly known as: ACTOS Take 1 tablet by mouth once daily   rosuvastatin 20 MG tablet Commonly known as: CRESTOR Take 1 tablet (20 mg total) by mouth daily.   Tandem 162-115.2 MG Caps capsule Generic drug: ferrous fumarate-iron polysaccharide complex TAKE 2 CAPSULES ONE TIME DAILY   Tresiba FlexTouch 100 UNIT/ML FlexTouch Pen Generic drug: insulin degludec INJECT 14 UNITS UNDER THE SKIN 1 TIME DAILY       Allergies: No Known Allergies  Past Medical History:  Diagnosis Date  . Seizures ([place]) 1991  . Urinary tract infection 07/2012    No past surgical history on file.  Family History  Problem Relation Age of Onset  . Hyperlipidemia Mother   . Diabetes Mother   . Hypertension Mother   . Diabetes Father   . Hypertension Father   . Cancer Neg Hx     Social History:  reports that he has never smoked. He has never used smokeless tobacco. No history on file for alcohol use and drug use.  Review of Systems:   Hypertension:  has had long-standing hypertension, Is on Benicar HCT and carvedilol Blood pressure tends to be slightly higher in the office at times No lightheadedness usually  BP checked periodically at home, recently as low as 103/48, otherwise systolic 762-831; using a WRIST which had not been compared to the office readings   BP Readings from Last 3 Encounters:   01/27/20 116/64  07/01/19 140/80  06/12/18 130/70   RENAL dysfunction: His creatinine levels are usually mildly increased or upper normal: No history of proteinuria and he does not take any OTC nonsteroidal drugs   Lab Results  Component Value Date   CREATININE 1.51 (H) 01/13/2020   CREATININE 1.54 (H) 07/01/2019   CREATININE 1.48 12/08/2018     Lipids: LDL is excellent  with  his taking only half a tablet of Crestor 20 mg, last LDL as follows  Lab Results  Component Value Date   CHOL 102 01/13/2020   HDL 35.70 (L) 01/13/2020   LDLCALC 52 01/13/2020   TRIG 74.0 01/13/2020   CHOLHDL 3 01/13/2020     Has had mild chronic anemia, has been recommended iron long-term which has usually normalized his hemoglobin This is followed by PCP  Lab Results  Component Value Date   HGB 11.7 (L) 07/01/2019     CARDIAC history:  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 which was asymptomatic and not associated with CAD Is taking carvedilol  No recent history of gout, taking allopurinol with uric acid level 7  LABS:  No visits with results within 1 Week(s) from this visit.  Latest known visit with results is:  Lab on 01/13/2020  Component Date Value Ref Range Status  . Uric Acid, Serum 01/13/2020 7.0  4.0 - 7.8 mg/dL Final  . Cholesterol 01/13/2020 102  0 - 200 mg/dL Final   ATP III Classification       Desirable:  < 200 mg/dL               Borderline High:  200 - 239 mg/dL          High:  > = 240 mg/dL  . Triglycerides 01/13/2020 74.0  0 - 149 mg/dL Final   Normal:  <150 mg/dLBorderline High:  150 - 199 mg/dL  . HDL 01/13/2020 35.70* >39.00 mg/dL Final  . VLDL 01/13/2020 14.8  0.0 - 40.0 mg/dL Final  . LDL Cholesterol 01/13/2020 52  0 - 99 mg/dL Final  . Total CHOL/HDL Ratio 01/13/2020 3   Final                  Men          Women1/2 Average Risk     3.4          3.3Average Risk          5.0          4.42X Average Risk          9.6          7.13X Average Risk          15.0           11.0                      . NonHDL 01/13/2020 66.43   Final   NOTE:  Non-HDL goal should be 30 mg/dL higher than patient's LDL goal (i.e. LDL goal of < 70 mg/dL, would have non-HDL goal of < 100 mg/dL)  . Sodium 01/13/2020 139  135 - 145 mEq/L Final  . Potassium 01/13/2020 4.0  3.5 - 5.1 mEq/L Final  . Chloride 01/13/2020 106  96 - 112 mEq/L Final  . CO2 01/13/2020 26  19 - 32 mEq/L Final  . Glucose, Bld 01/13/2020 80  70 - 99 mg/dL Final  . BUN 01/13/2020 26* 6 - 23 mg/dL Final  . Creatinine, Ser 01/13/2020 1.51* 0.40 - 1.50 mg/dL Final  . Total Bilirubin 01/13/2020 0.7  0.2 - 1.2 mg/dL Final  . Alkaline Phosphatase 01/13/2020 77  39 - 117 U/L Final  . AST 01/13/2020 14  0 - 37 U/L Final  . ALT 01/13/2020 9  0 - 53 U/L Final  . Total Protein 01/13/2020 8.0  6.0 - 8.3 g/dL Final  .  Albumin 01/13/2020 4.2  3.5 - 5.2 g/dL Final  . GFR 01/13/2020 54.13* >60.00 mL/min Final  . Calcium 01/13/2020 9.6  8.4 - 10.5 mg/dL Final  . Hgb A1c MFr Bld 01/13/2020 5.7  4.6 - 6.5 % Final   Glycemic Control Guidelines for People with Diabetes:Non Diabetic:  <6%Goal of Therapy: <7%Additional Action Suggested:  >8%   . Microalb, Ur 01/13/2020 1.3  0.0 - 1.9 mg/dL Final  . Creatinine,U 01/13/2020 123.8  mg/dL Final  . Microalb Creat Ratio 01/13/2020 1.1  0.0 - 30.0 mg/g Final     Examination:   BP 116/64 (Patient Position: Standing)   Pulse 78   Ht '5\' 10"'  (1.778 m)   Wt 218 lb (98.9 kg)   SpO2 98%   BMI 31.28 kg/m   Body mass index is 31.28 kg/m.       ASSESSMENT/ PLAN:    Diabetes type 2 on insulin See history of present illness for detailed discussion of his current management, blood sugar patterns and problems identified  Blood glucose control is excellent again with A1c 5.7  He is taking basal bolus insulin regimen with mealtime coverage using NovoLog about 14 units twice a day  His blood sugars in the mornings are averaging only 88 but not as low over the last 5 days or so Mostly  readings at night are checked at bedtime and not 2 hours after eating with variable results Today discussed the need to adjust mealtime dose up or down 2 to 4 units based on his meal size and carbohydrate intake He can likely take only 6-8 units for small meals or salads and up to 18 units for higher fat meals or pizza If his blood sugars start trending down again in the morning he can reduce Tresiba to 12 No change in Metformin considering his creatinine clearance is over 50    Hyperlipidemia: Has been treated with Crestor 10 mg and will need follow-up labs today    RENAL dysfunction: Creatinine has been relatively high This may be related to his blood pressure being low normal now especially standing in the office  Hypertension: Blood pressure is relatively low at home and also standing up on second measurement today He will stop HCTZ and just take Benicar by itself along with his Coreg He will come back in a month for follow-up renal function   History of anemia with mild iron deficiency: Needs follow-up with PCP  Annual eye exams  History of hyperuricemia: 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

## 2020-01-27 NOTE — Patient Instructions (Signed)
If am sugar stays < 90 go to 12 [place]

## 2020-01-28 ENCOUNTER — Telehealth: Payer: Self-pay | Admitting: Endocrinology

## 2020-01-28 ENCOUNTER — Encounter: Payer: Self-pay | Admitting: Endocrinology

## 2020-01-28 NOTE — Telephone Encounter (Signed)
-----   Message from Reather Littler, MD sent at 01/28/2020 11:59 AM EDT ----- Regarding: Repeat lab work Please schedule him for lab work in 1 month to recheck kidneys

## 2020-01-28 NOTE — Telephone Encounter (Signed)
LVM for patient to call and schedule 1 month labs to recheck kidney.

## 2020-02-09 ENCOUNTER — Ambulatory Visit: Payer: Self-pay | Attending: Internal Medicine

## 2020-02-09 DIAGNOSIS — Z23 Encounter for immunization: Secondary | ICD-10-CM

## 2020-02-09 NOTE — Progress Notes (Signed)
° °  Covid-19 Vaccination Clinic  Name:  Eric Grant    MRN: [id number] DOB: 10/29/1940  02/09/2020  [name] was observed post Covid-19 immunization for 15 minutes without incident. He was provided with Vaccine Information Sheet and instruction to access the V-Safe system.   [name] was instructed to call 911 with any severe reactions post vaccine:  Difficulty breathing   Swelling of face and throat   A fast heartbeat   A bad rash all over body   Dizziness and weakness

## 2020-02-19 ENCOUNTER — Other Ambulatory Visit: Payer: Self-pay | Admitting: Endocrinology

## 2020-02-23 ENCOUNTER — Other Ambulatory Visit: Payer: Self-pay | Admitting: Endocrinology

## 2020-02-23 MED ORDER — ROSUVASTATIN CALCIUM 20 MG PO TABS
20.0000 mg | ORAL_TABLET | Freq: Every day | ORAL | 1 refills | Status: DC
Start: 2020-02-23 — End: 2020-08-21

## 2020-02-23 NOTE — Telephone Encounter (Signed)
Tried to call patient but VM is not set up. It shows that prescription have already been sent on 02/19/2020.

## 2020-02-23 NOTE — Telephone Encounter (Signed)
E-Prescribing status for rosuvastatin states: E-Prescribing Status: Verification status not available for this order

## 2020-02-23 NOTE — Telephone Encounter (Signed)
Patient requests the following RX be sent asap-[facility] does not have the RX and told patient to call Dr. Remus Blake office:  Medication Refill Request  Did you call your pharmacy and request this refill first? Yes . If patient has not contacted pharmacy first, instruct them to do so for future refills.  . Remind them that contacting the pharmacy for their refill is the quickest method to get the refill.  . Refill policy also stated that it will take anywhere between 24-72 hours to receive the refill.    Name of medication?  rosuvastatin (CRESTOR) 20 MG tablet  Is this a 90 day supply? Yes  Name and location of pharmacy?  [facility] - [place], [place] - [address] Phone:  [phone]  Fax:  [phone]

## 2020-02-23 NOTE — Telephone Encounter (Signed)
Resent Rx. Patient have been notified.

## 2020-02-24 ENCOUNTER — Other Ambulatory Visit: Payer: Self-pay | Admitting: *Deleted

## 2020-02-24 MED ORDER — OLMESARTAN MEDOXOMIL 20 MG PO TABS
20.0000 mg | ORAL_TABLET | Freq: Every day | ORAL | 1 refills | Status: DC
Start: 2020-02-24 — End: 2020-10-07

## 2020-02-26 ENCOUNTER — Other Ambulatory Visit: Payer: Self-pay | Admitting: *Deleted

## 2020-03-03 ENCOUNTER — Other Ambulatory Visit: Payer: Medicare HMO

## 2020-03-04 ENCOUNTER — Other Ambulatory Visit: Payer: Self-pay

## 2020-03-04 ENCOUNTER — Other Ambulatory Visit (INDEPENDENT_AMBULATORY_CARE_PROVIDER_SITE_OTHER): Payer: Medicare HMO

## 2020-03-04 DIAGNOSIS — I10 Essential (primary) hypertension: Secondary | ICD-10-CM

## 2020-03-04 DIAGNOSIS — N289 Disorder of kidney and ureter, unspecified: Secondary | ICD-10-CM | POA: Diagnosis not present

## 2020-03-04 LAB — BASIC METABOLIC PANEL
BUN: 13 mg/dL (ref 6–23)
CO2: 29 mEq/L (ref 19–32)
Calcium: 9.7 mg/dL (ref 8.4–10.5)
Chloride: 103 mEq/L (ref 96–112)
Creatinine, Ser: 1.28 mg/dL (ref 0.40–1.50)
GFR: 53.17 mL/min — ABNORMAL LOW (ref >60.00)
Glucose, Bld: 100 mg/dL — ABNORMAL HIGH (ref 70–99)
Potassium: 3.8 mEq/L (ref 3.5–5.1)
Sodium: 139 mEq/L (ref 135–145)

## 2020-03-07 NOTE — Progress Notes (Signed)
Please call to let patient know that the kidney function results are normal and no further action needed.  Please confirm blood pressure is okay at home

## 2020-03-21 ENCOUNTER — Other Ambulatory Visit: Payer: Self-pay | Admitting: Endocrinology

## 2020-04-06 ENCOUNTER — Other Ambulatory Visit: Payer: Self-pay | Admitting: Endocrinology

## 2020-04-07 ENCOUNTER — Other Ambulatory Visit: Payer: Self-pay | Admitting: Endocrinology

## 2020-04-07 DIAGNOSIS — I10 Essential (primary) hypertension: Secondary | ICD-10-CM

## 2020-05-23 ENCOUNTER — Other Ambulatory Visit: Payer: Self-pay | Admitting: Endocrinology

## 2020-07-25 DIAGNOSIS — Z1389 Encounter for screening for other disorder: Secondary | ICD-10-CM | POA: Diagnosis not present

## 2020-07-25 DIAGNOSIS — Z1211 Encounter for screening for malignant neoplasm of colon: Secondary | ICD-10-CM | POA: Diagnosis not present

## 2020-07-25 DIAGNOSIS — Z794 Long term (current) use of insulin: Secondary | ICD-10-CM | POA: Diagnosis not present

## 2020-07-25 DIAGNOSIS — I10 Essential (primary) hypertension: Secondary | ICD-10-CM | POA: Diagnosis not present

## 2020-07-25 DIAGNOSIS — M109 Gout, unspecified: Secondary | ICD-10-CM | POA: Diagnosis not present

## 2020-07-25 DIAGNOSIS — J309 Allergic rhinitis, unspecified: Secondary | ICD-10-CM | POA: Diagnosis not present

## 2020-07-25 DIAGNOSIS — E7800 Pure hypercholesterolemia, unspecified: Secondary | ICD-10-CM | POA: Diagnosis not present

## 2020-07-25 DIAGNOSIS — I2510 Atherosclerotic heart disease of native coronary artery without angina pectoris: Secondary | ICD-10-CM | POA: Diagnosis not present

## 2020-07-25 DIAGNOSIS — Z0000 Encounter for general adult medical examination without abnormal findings: Secondary | ICD-10-CM | POA: Diagnosis not present

## 2020-07-25 DIAGNOSIS — N1831 Chronic kidney disease, stage 3a: Secondary | ICD-10-CM | POA: Diagnosis not present

## 2020-07-25 DIAGNOSIS — E1122 Type 2 diabetes mellitus with diabetic chronic kidney disease: Secondary | ICD-10-CM | POA: Diagnosis not present

## 2020-07-26 ENCOUNTER — Other Ambulatory Visit: Payer: Medicare HMO

## 2020-07-27 DIAGNOSIS — Z1211 Encounter for screening for malignant neoplasm of colon: Secondary | ICD-10-CM | POA: Diagnosis not present

## 2020-07-29 ENCOUNTER — Ambulatory Visit: Payer: Medicare HMO | Admitting: Endocrinology

## 2020-08-16 ENCOUNTER — Other Ambulatory Visit: Payer: Self-pay | Admitting: Endocrinology

## 2020-08-16 DIAGNOSIS — E782 Mixed hyperlipidemia: Secondary | ICD-10-CM

## 2020-08-16 DIAGNOSIS — D508 Other iron deficiency anemias: Secondary | ICD-10-CM

## 2020-08-16 DIAGNOSIS — E119 Type 2 diabetes mellitus without complications: Secondary | ICD-10-CM

## 2020-08-17 ENCOUNTER — Other Ambulatory Visit (INDEPENDENT_AMBULATORY_CARE_PROVIDER_SITE_OTHER): Payer: Medicare HMO

## 2020-08-17 ENCOUNTER — Other Ambulatory Visit: Payer: Self-pay

## 2020-08-17 DIAGNOSIS — E782 Mixed hyperlipidemia: Secondary | ICD-10-CM

## 2020-08-17 DIAGNOSIS — D508 Other iron deficiency anemias: Secondary | ICD-10-CM | POA: Diagnosis not present

## 2020-08-17 DIAGNOSIS — E119 Type 2 diabetes mellitus without complications: Secondary | ICD-10-CM | POA: Diagnosis not present

## 2020-08-17 LAB — COMPREHENSIVE METABOLIC PANEL
ALT: 10 U/L (ref 0–53)
AST: 15 U/L (ref 0–37)
Albumin: 3.9 g/dL (ref 3.5–5.2)
Alkaline Phosphatase: 74 U/L (ref 39–117)
BUN: 16 mg/dL (ref 6–23)
CO2: 28 mEq/L (ref 19–32)
Calcium: 9.5 mg/dL (ref 8.4–10.5)
Chloride: 105 mEq/L (ref 96–112)
Creatinine, Ser: 1.29 mg/dL (ref 0.40–1.50)
GFR: 52.51 mL/min — ABNORMAL LOW (ref >60.00)
Glucose, Bld: 78 mg/dL (ref 70–99)
Potassium: 3.9 mEq/L (ref 3.5–5.1)
Sodium: 140 mEq/L (ref 135–145)
Total Bilirubin: 1 mg/dL (ref 0.2–1.2)
Total Protein: 7.9 g/dL (ref 6.0–8.3)

## 2020-08-17 LAB — CBC
HCT: 38 % — ABNORMAL LOW (ref 39.0–52.0)
Hemoglobin: 11.8 g/dL — ABNORMAL LOW (ref 13.0–17.0)
MCHC: 31 g/dL (ref 30.0–36.0)
MCV: 84.9 fl (ref 78.0–100.0)
Platelets: 233 10*3/uL (ref 150.0–400.0)
RBC: 4.48 Mil/uL (ref 4.22–5.81)
RDW: 15.2 % (ref 11.5–15.5)
WBC: 5.9 10*3/uL (ref 4.0–10.5)

## 2020-08-17 LAB — LIPID PANEL
Cholesterol: 97 mg/dL (ref 0–200)
HDL: 37.9 mg/dL — ABNORMAL LOW (ref >39.00)
LDL Cholesterol: 44 mg/dL (ref 0–99)
NonHDL: 59.1
Total CHOL/HDL Ratio: 3
Triglycerides: 74 mg/dL (ref 0.0–149.0)
VLDL: 14.8 mg/dL (ref 0.0–40.0)

## 2020-08-17 LAB — HEMOGLOBIN A1C: Hgb A1c MFr Bld: 6.2 % (ref 4.6–6.5)

## 2020-08-20 ENCOUNTER — Other Ambulatory Visit: Payer: Self-pay | Admitting: Endocrinology

## 2020-08-23 ENCOUNTER — Encounter: Payer: Self-pay | Admitting: Endocrinology

## 2020-08-23 ENCOUNTER — Ambulatory Visit: Payer: Medicare HMO | Admitting: Endocrinology

## 2020-08-23 ENCOUNTER — Other Ambulatory Visit: Payer: Self-pay

## 2020-08-23 VITALS — BP 160/90 | HR 75 | Ht 70.0 in | Wt 221.4 lb

## 2020-08-23 DIAGNOSIS — D508 Other iron deficiency anemias: Secondary | ICD-10-CM | POA: Diagnosis not present

## 2020-08-23 DIAGNOSIS — R809 Proteinuria, unspecified: Secondary | ICD-10-CM | POA: Diagnosis not present

## 2020-08-23 DIAGNOSIS — I10 Essential (primary) hypertension: Secondary | ICD-10-CM

## 2020-08-23 DIAGNOSIS — E119 Type 2 diabetes mellitus without complications: Secondary | ICD-10-CM

## 2020-08-23 NOTE — Progress Notes (Signed)
Patient ID: Jimmie Rueter, male   DOB: [birth year], 80 y.o.   MRN: [id number]     Reason for Appointment: Follow-up, 39-monthvisit  History of Present Illness    Type 2 DIABETES MELITUS, date of diagnosis:  1988    Previous history: He has been on basal bolus insulin regimen for several years with excellent control He takes mealtime insulin only at breakfast and lunch Also has been taking Actoplusmet with good results for several years and no side effects  Recent history:  Insulin regimen:   TRESIBA 14 units in morning NOVOLOG insulin insulin 4 units in morning and 14 at supper  His A1c again is excellent and now 6.2 compared to 5.7 and usually below 6   Oral hypoglycemic drugs: metformin 850 twice a day, Actos 15 mg daily  Current blood sugar patterns, management and problems identified:  He again forgets to check his blood sugars after meals and is monitoring only in the morning  Only today had a low sugar 59 otherwise blood sugars are fairly good in the morning, recently averaging below 100  His weight is about the same overall  Recently is more active starting to move along with other outside activities  No hypoglycemia reported after dinner overnight  He will also skip his morning NovoLog with blood sugars are normal       Monitors blood glucose: .Marland Kitchen   Glucometer:  Accu-Chek Aviva      Blood Glucose readings from patient download:  FASTING range 59-142 with average 95  Previously:  PRE-MEAL Fasting Lunch Dinner Bedtime Overall  Glucose range:  59-137      Mean/median:  88    130  102   POST-MEAL PC Breakfast PC Lunch PC Dinner  Glucose range:  ? ?  Mean/median:                       Physical activity: exercise:  some yard work and walking, limited by knee pain   Weight control:    Wt Readings from Last 3 Encounters:  08/23/20 221 lb 6.4 oz (100.4 kg)  01/27/20 218 lb (98.9 kg)  07/01/19 222 lb (100.7 kg)         Diabetes labs:  Lab  Results  Component Value Date   HGBA1C 6.2 08/17/2020   HGBA1C 5.7 01/13/2020   HGBA1C 5.7 (A) 07/01/2019   Lab Results  Component Value Date   MICROALBUR 1.3 01/13/2020   LDLCALC 44 08/17/2020   CREATININE 1.29 08/17/2020    OTHER active problems: See review of systems   Allergies as of 08/23/2020   No Known Allergies     Medication List       Accurate as of August 23, 2020 10:03 AM. If you have any questions, ask your nurse or doctor.        Accu-Chek Aviva Plus w/Device Kit USE  TO CHECK BLOOD SUGAR FOUR TIMES DAILY   Accu-Chek Aviva Soln Use as instructed.   True Metrix Level 1 Low Soln Use to calibrate glucometer   Accu-Chek FastClix Lancets Misc USE  TO CHECK BLOOD SUGAR FOUR TIMES DAILY   TRUEplus Lancets 28G Misc TEST BLOOD SUGAR FOUR TIMES DAILY   albuterol 108 (90 Base) MCG/ACT inhaler Commonly known as: VENTOLIN HFA 2 puff as needed   Alcohol Swabs 70 % Pads Use as directed   allopurinol 100 MG tablet Commonly known as: ZYLOPRIM TAKE 1 TABLET EVERY DAY   aspirin 81  MG tablet Take 81 mg by mouth daily.   B-D ULTRAFINE III SHORT PEN 31G X 8 MM Misc Generic drug: Insulin Pen Needle 1 each by Other route 3 (three) times daily. E11.9   carvedilol 12.5 MG tablet Commonly known as: COREG TAKE 1 TABLET TWICE DAILY WITH A MEAL   cholecalciferol 10 MCG (400 UNIT) Tabs tablet Commonly known as: VITAMIN D3 Take 400 Units by mouth.   glucose blood test strip Commonly known as: Accu-Chek Aviva Plus USE TO CHECK BLOOD SUGAR FOUR TIMES DAILY   metFORMIN 850 MG tablet Commonly known as: GLUCOPHAGE Take 1 tablet (850 mg total) by mouth 2 (two) times daily with a meal.   NovoLOG FlexPen 100 UNIT/ML FlexPen Generic drug: insulin aspart INJECT 4 UNITS SUBCUTANEOUSLY AT BREAKFAST AND 14 TO 15 UNITS  AT SUPPER   olmesartan 20 MG tablet Commonly known as: BENICAR Take 1 tablet (20 mg total) by mouth daily.   pioglitazone 15 MG tablet Commonly  known as: ACTOS Take 1 tablet by mouth once daily   rosuvastatin 20 MG tablet Commonly known as: CRESTOR TAKE 1 TABLET EVERY DAY   Tandem 162-115.2 MG Caps capsule Generic drug: ferrous fumarate-iron polysaccharide complex TAKE 2 CAPSULES ONE TIME DAILY   Tresiba FlexTouch 100 UNIT/ML FlexTouch Pen Generic drug: insulin degludec INJECT 14 UNITS UNDER THE SKIN 1 TIME DAILY       Allergies: No Known Allergies  Past Medical History:  Diagnosis Date  . Seizures ([place]) 1991  . Urinary tract infection 07/2012    No past surgical history on file.  Family History  Problem Relation Age of Onset  . Hyperlipidemia Mother   . Diabetes Mother   . Hypertension Mother   . Diabetes Father   . Hypertension Father   . Cancer Neg Hx     Social History:  reports that he has never smoked. He has never used smokeless tobacco. No history on file for alcohol use and drug use.  Review of Systems:   Hypertension:  has had long-standing hypertension, Is on Benicar and carvedilol Blood pressure tends to be higher in the office at times Although HCTZ was stopped on his last visit his PCP still has Benicar HCTZ listed  Blood pressure is unusually high but he appears to be taking a decongestant nasal spray periodically especially recently  BP checked periodically at home, recently 130s but this morning was about 170 at home also Previously with PCP last month it was 139/82 Repeat blood pressure today was 180/88  BP Readings from Last 3 Encounters:  08/23/20 (!) 160/90  01/27/20 116/64  07/01/19 140/80   RENAL dysfunction: His creatinine levels are relatively better without HCTZ  No history of proteinuria and he does not take any OTC nonsteroidal drugs   Lab Results  Component Value Date   CREATININE 1.29 08/17/2020   CREATININE 1.28 03/04/2020   CREATININE 1.51 (H) 01/13/2020     Lipids: LDL is excellent  with his taking only half a tablet of Crestor 20 mg, last LDL as  follows  Lab Results  Component Value Date   CHOL 97 08/17/2020   HDL 37.90 (L) 08/17/2020   LDLCALC 44 08/17/2020   TRIG 74.0 08/17/2020   CHOLHDL 3 08/17/2020     Has had mild chronic anemia, has been recommended iron long-term which has usually normalized his hemoglobin This is followed by PCP  Lab Results  Component Value Date   HGB 11.8 (L) 08/17/2020     CARDIAC  history:  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 WBC 08/17/2020 5.9  4.0 - 10.5 K/uL Final  . RBC 08/17/2020 4.48  4.22 - 5.81 Mil/uL Final  . Platelets 08/17/2020 233.0  150.0 - 400.0 K/uL Final  . Hemoglobin 08/17/2020 11.8* 13.0 - 17.0 g/dL Final  . HCT 08/17/2020 38.0* 39.0 - 52.0 % Final  . MCV 08/17/2020 84.9  78.0 - 100.0 fl Final  . MCHC 08/17/2020 31.0  30.0 - 36.0 g/dL Final  . RDW 08/17/2020 15.2  11.5 - 15.5 % Final  . Cholesterol 08/17/2020 97  0 - 200 mg/dL Final   ATP III Classification       Desirable:  < 200 mg/dL               Borderline High:  200 - 239 mg/dL          High:  > = 240 mg/dL  . Triglycerides 08/17/2020 74.0  0.0 - 149.0 mg/dL Final   Normal:  <150 mg/dLBorderline High:  150 - 199 mg/dL  . HDL 08/17/2020 37.90* >39.00 mg/dL Final  . VLDL 08/17/2020 14.8  0.0 - 40.0 mg/dL Final  . LDL Cholesterol 08/17/2020 44  0 - 99 mg/dL Final  . Total CHOL/HDL Ratio 08/17/2020 3   Final                  Men          Women1/2 Average Risk     3.4          3.3Average Risk          5.0          4.42X Average Risk          9.6          7.13X Average Risk          15.0          11.0                      . NonHDL 08/17/2020 59.10   Final   NOTE:  Non-HDL goal should be 30 mg/dL higher than patient's LDL goal (i.e. LDL goal of < 70 mg/dL, would have non-HDL goal of < 100  mg/dL)  . Sodium 08/17/2020 140  135 - 145 mEq/L Final  . Potassium 08/17/2020 3.9  3.5 - 5.1 mEq/L Final  . Chloride 08/17/2020 105  96 - 112 mEq/L Final  . CO2 08/17/2020 28  19 - 32 mEq/L Final  . Glucose, Bld 08/17/2020 78  70 - 99 mg/dL Final  . BUN 08/17/2020 16  6 - 23 mg/dL Final  . Creatinine, Ser 08/17/2020 1.29  0.40 - 1.50 mg/dL Final  . Total Bilirubin 08/17/2020 1.0  0.2 - 1.2 mg/dL Final  . Alkaline Phosphatase 08/17/2020 74  39 - 117 U/L Final  . AST 08/17/2020 15  0 - 37 U/L Final  . ALT 08/17/2020 10  0 - 53 U/L Final  . Total Protein 08/17/2020 7.9  6.0 - 8.3 g/dL Final  . Albumin 08/17/2020 3.9  3.5 - 5.2 g/dL Final  . GFR 08/17/2020 52.51* >60.00 mL/min Final   Calculated using the CKD-EPI Creatinine Equation (2021)  . Calcium 08/17/2020  9.5  8.4 - 10.5 mg/dL Final  . Hgb A1c MFr Bld 08/17/2020 6.2  4.6 - 6.5 % Final   Glycemic Control Guidelines for People with Diabetes:Non Diabetic:  <6%Goal of Therapy: <7%Additional Action Suggested:  >8%      Examination:   BP (!) 160/90   Pulse 75   Ht '5\' 10"'  (1.778 m)   Wt 221 lb 6.4 oz (100.4 kg)   SpO2 97%   BMI 31.77 kg/m   Body mass index is 31.77 kg/m.       ASSESSMENT/ PLAN:    Diabetes type 2 on insulin See history of present illness for detailed discussion of his current management, blood sugar patterns and problems identified  Blood glucose control is excellent again with A1c 6.2  He is taking basal bolus insulin regimen with mealtime coverage using NovoLog and Tresiba  His blood sugars in the mornings are averaging below 100 again including a low sugar this morning He will reduce his [place] Aas to 12 units Reminded him to check more readings after dinner Continue metformin     Hyperlipidemia: Has been treated with Crestor 10 mg adequately    RENAL dysfunction: Creatinine has been recently better, previously blood pressure has been low normal No microalbuminuria in the past but it was  relatively higher with PCP recently  Hypertension: Blood pressure is unusually high today Although his meter has been relatively accurate at home and has blood pressure was fairly good last month not clear why it is higher today He may be getting high readings from taking Afrin nasal spray  Discussed appropriate treatment of allergic rhinitis and nasal congestion with steroid nasal sprays, antihistamines and saline nasal spray and he will stop any decongestant spray  If his blood pressure is consistently high will restart HCTZ, he will call to report his   History of anemia with mild iron deficiency: Hemoglobin is consistently around 11.8 followed by PCP   Follow-up in 3 months We will recheck urine microalbumin on the next visit  There are no Patient Instructions on file for this visit.   Elayne Snare 08/23/2020, 10:03 AM

## 2020-08-23 NOTE — Patient Instructions (Signed)
Tresiba 12 units  Check blood sugars on waking up days 3-4  a week  Also check blood sugars about 2 hours after meals and do this after different meals by rotation  Recommended blood sugar levels on waking up are 80-120 and about 2 hours after meal is 130-160  Please bring your blood sugar monitor to each visit, thank you  No decongestant nose spray  Saline nose spray  Allergy Flonase, Nasonex like sprays daily  Claritin or Allegra pills   Call if BP stays high

## 2020-10-05 ENCOUNTER — Other Ambulatory Visit: Payer: Self-pay | Admitting: Endocrinology

## 2020-10-07 ENCOUNTER — Other Ambulatory Visit: Payer: Self-pay | Admitting: Endocrinology

## 2020-11-21 ENCOUNTER — Other Ambulatory Visit: Payer: Medicare HMO

## 2020-11-24 ENCOUNTER — Ambulatory Visit: Payer: Medicare HMO | Admitting: Endocrinology

## 2020-12-15 ENCOUNTER — Other Ambulatory Visit: Payer: Self-pay

## 2020-12-15 ENCOUNTER — Other Ambulatory Visit (INDEPENDENT_AMBULATORY_CARE_PROVIDER_SITE_OTHER): Payer: Medicare HMO

## 2020-12-15 DIAGNOSIS — R809 Proteinuria, unspecified: Secondary | ICD-10-CM

## 2020-12-15 DIAGNOSIS — E119 Type 2 diabetes mellitus without complications: Secondary | ICD-10-CM

## 2020-12-15 LAB — BASIC METABOLIC PANEL
BUN: 15 mg/dL (ref 6–23)
CO2: 28 mEq/L (ref 19–32)
Calcium: 9.3 mg/dL (ref 8.4–10.5)
Chloride: 105 mEq/L (ref 96–112)
Creatinine, Ser: 1.33 mg/dL (ref 0.40–1.50)
GFR: 50.5 mL/min — ABNORMAL LOW (ref >60.00)
Glucose, Bld: 121 mg/dL — ABNORMAL HIGH (ref 70–99)
Potassium: 4 mEq/L (ref 3.5–5.1)
Sodium: 139 mEq/L (ref 135–145)

## 2020-12-15 LAB — URINALYSIS, ROUTINE W REFLEX MICROSCOPIC
Bilirubin Urine: NEGATIVE
Hgb urine dipstick: NEGATIVE
Ketones, ur: NEGATIVE
Leukocytes,Ua: NEGATIVE
Nitrite: NEGATIVE
RBC / HPF: NONE SEEN (ref 0–?)
Specific Gravity, Urine: 1.02 (ref 1.000–1.030)
Urine Glucose: NEGATIVE
Urobilinogen, UA: 0.2 (ref 0.0–1.0)
pH: 5.5 (ref 5.0–8.0)

## 2020-12-15 LAB — MICROALBUMIN / CREATININE URINE RATIO
Creatinine,U: 135.3 mg/dL
Microalb Creat Ratio: 7 mg/g (ref 0.0–30.0)
Microalb, Ur: 9.4 mg/dL — ABNORMAL HIGH (ref 0.0–1.9)

## 2020-12-15 LAB — HEMOGLOBIN A1C: Hgb A1c MFr Bld: 6.1 % (ref 4.6–6.5)

## 2020-12-19 ENCOUNTER — Ambulatory Visit (INDEPENDENT_AMBULATORY_CARE_PROVIDER_SITE_OTHER): Payer: Medicare HMO | Admitting: Endocrinology

## 2020-12-19 ENCOUNTER — Other Ambulatory Visit: Payer: Self-pay

## 2020-12-19 VITALS — BP 128/84 | Ht 70.0 in | Wt 213.2 lb

## 2020-12-19 DIAGNOSIS — E790 Hyperuricemia without signs of inflammatory arthritis and tophaceous disease: Secondary | ICD-10-CM | POA: Diagnosis not present

## 2020-12-19 DIAGNOSIS — E782 Mixed hyperlipidemia: Secondary | ICD-10-CM | POA: Diagnosis not present

## 2020-12-19 DIAGNOSIS — I10 Essential (primary) hypertension: Secondary | ICD-10-CM

## 2020-12-19 DIAGNOSIS — E119 Type 2 diabetes mellitus without complications: Secondary | ICD-10-CM | POA: Diagnosis not present

## 2020-12-19 LAB — POCT GLYCOSYLATED HEMOGLOBIN (HGB A1C): Hemoglobin A1C: 6.2 % — AB (ref 4.0–5.6)

## 2020-12-19 MED ORDER — ACCU-CHEK AVIVA PLUS W/DEVICE KIT: PACK | 0 refills | Status: DC

## 2020-12-19 NOTE — Progress Notes (Signed)
Patient ID: Eric Grant, male   DOB: 1940-11-30, 80 y.o.   MRN: [id number]     Reason for Appointment: Follow-up, 34-monthvisit  History of Present Illness    Type 2 DIABETES MELITUS, date of diagnosis:  1988    Previous history: He has been on basal bolus insulin regimen for several years with excellent control He takes mealtime insulin only at breakfast and lunch Also has been taking Actoplusmet with good results for several years and no side effects  Recent history:  Insulin regimen:   TRESIBA 12-14 units in morning NOVOLOG insulin insulin 4 units in morning and 14 at supper  His A1c again is excellent and now 6.1 compared to 5.7 and usually below 6   Oral hypoglycemic drugs: metformin 850 twice a day, Actos 15 mg daily  Current blood sugar patterns, management and problems identified: He has an old monitor and occasionally gets falsely high readings such as a reading of 194  As before he has not checked blood sugars after meals and only a few readings before dinner  He is unclear how much insulin he is taking for [place] he was told to take 12 units instead of 14 to reduce low sugars in the mornings  However again has a couple of readings in the 60s in the morning without symptoms  Overall he has been quite active working outside and has lost about 8 pounds  Usually not having symptoms of low sugars during the daytime with activity  However if his blood sugar is low normal in the morning he will skip his NovoLog as before  Did not adjust his mealtime dose based on what he is eating especially in the evening when he takes 14 units       Monitors blood glucose: .Marland Kitchen   Glucometer:  Accu-Chek Aviva      Blood Glucose readings from patient download:  FASTING range 61-194, average 104 for 30 days  PREVIOUS range 59-142 with average 95  Exercise: Some yard work and walking, limited by knee pain   Weight control:    Wt Readings from Last 3 Encounters:   12/19/20 213 lb 3.2 oz (96.7 kg)  08/23/20 221 lb 6.4 oz (100.4 kg)  01/27/20 218 lb (98.9 kg)         Diabetes labs:  Lab Results  Component Value Date   HGBA1C 6.2 (A) 12/19/2020   HGBA1C 6.1 12/15/2020   HGBA1C 6.2 08/17/2020   Lab Results  Component Value Date   MICROALBUR 9.4 (H) 12/15/2020   LDLCALC 44 08/17/2020   CREATININE 1.33 12/15/2020    OTHER active problems: See review of systems   Allergies as of 12/19/2020   No Known Allergies      Medication List        Accurate as of December 19, 2020 11:59 PM. If you have any questions, ask your nurse or doctor.          Accu-Chek Aviva Plus w/Device Kit USE  TO CHECK BLOOD SUGAR FOUR TIMES DAILYUSE What changed: additional instructions Changed by: AElayne Snare MD   Accu-Chek Aviva Soln Use as instructed.   True Metrix Level 1 Low Soln Use to calibrate glucometer   Accu-Chek FastClix Lancets Misc USE  TO CHECK BLOOD SUGAR FOUR TIMES DAILY   TRUEplus Lancets 28G Misc TEST BLOOD SUGAR FOUR TIMES DAILY   albuterol 108 (90 Base) MCG/ACT inhaler Commonly known as: VENTOLIN HFA 2 puff as needed  Alcohol Swabs 70 % Pads Use as directed   allopurinol 100 MG tablet Commonly known as: ZYLOPRIM TAKE 1 TABLET EVERY DAY   aspirin 81 MG tablet Take 81 mg by mouth daily.   B-D ULTRAFINE III SHORT PEN 31G X 8 MM Misc Generic drug: Insulin Pen Needle 1 each by Other route 3 (three) times daily. E11.9   carvedilol 12.5 MG tablet Commonly known as: COREG TAKE 1 TABLET TWICE DAILY WITH A MEAL   cholecalciferol 10 MCG (400 UNIT) Tabs tablet Commonly known as: VITAMIN D3 Take 400 Units by mouth.   glucose blood test strip Commonly known as: Accu-Chek Aviva Plus USE TO CHECK BLOOD SUGAR FOUR TIMES DAILY   metFORMIN 850 MG tablet Commonly known as: GLUCOPHAGE Take 1 tablet (850 mg total) by mouth 2 (two) times daily with a meal.   NovoLOG FlexPen 100 UNIT/ML FlexPen Generic drug: insulin  aspart INJECT 4 UNITS SUBCUTANEOUSLY AT BREAKFAST AND 14 TO 15 UNITS  AT SUPPER   olmesartan 20 MG tablet Commonly known as: BENICAR TAKE 1 TABLET EVERY DAY   pioglitazone 15 MG tablet Commonly known as: ACTOS Take 1 tablet by mouth once daily   rosuvastatin 20 MG tablet Commonly known as: CRESTOR TAKE 1 TABLET EVERY DAY   Tandem 162-115.2 MG Caps capsule Generic drug: ferrous fumarate-iron polysaccharide complex TAKE 2 CAPSULES ONE TIME DAILY   Tresiba FlexTouch 100 UNIT/ML FlexTouch Pen Generic drug: insulin degludec INJECT 14 UNITS UNDER THE SKIN 1 TIME DAILY        Allergies: No Known Allergies  Past Medical History:  Diagnosis Date   Seizures ([place]) 1991   Urinary tract infection 07/2012    No past surgical history on file.  Family History  Problem Relation Age of Onset   Hyperlipidemia Mother    Diabetes Mother    Hypertension Mother    Diabetes Father    Hypertension Father    Cancer Neg Hx     Social History:  reports that he has never smoked. He has never used smokeless tobacco. No history on file for alcohol use and drug use.  Review of Systems:   Hypertension:  has had long-standing hypertension, Is on Benicar and carvedilol Blood pressure tends to be higher in the office at times  Blood pressure is recently well controlled  BP checked periodically at home, recently 130s-170/<80   BP Readings from Last 3 Encounters:  12/19/20 128/84  08/23/20 (!) 160/90  01/27/20 116/64   RENAL dysfunction: His creatinine levels are more consistently abnormal now, higher when he was on HCTZ  No history of microalbuminuria  Lab Results  Component Value Date   CREATININE 1.33 12/15/2020   CREATININE 1.29 08/17/2020   CREATININE 1.28 03/04/2020     Lipids: LDL is excellent  with his taking only half a tablet of Crestor 20 mg, last LDL as follows  Lab Results  Component Value Date   CHOL 97 08/17/2020   HDL 37.90 (L) 08/17/2020   LDLCALC 44  08/17/2020   TRIG 74.0 08/17/2020   CHOLHDL 3 08/17/2020      CARDIAC history:  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 which was asymptomatic and not associated with CAD Is taking carvedilol  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, taking allopurinol with uric acid level from PCP was 5.3  LABS:  Office Visit on 12/19/2020  Component Date Value Ref Range Status   Hemoglobin A1C 12/19/2020 6.2 (A) 4.0 - 5.6 % Final  Lab on 12/15/2020  Component Date Value Ref Range Status   Sodium 12/15/2020 139  135 - 145 mEq/L Final   Potassium 12/15/2020 4.0  3.5 - 5.1 mEq/L Final   Chloride 12/15/2020 105  96 - 112 mEq/L Final   CO2 12/15/2020 28  19 - 32 mEq/L Final   Glucose, Bld 12/15/2020 121 (A) 70 - 99 mg/dL Final   BUN 12/15/2020 15  6 - 23 mg/dL Final   Creatinine, Ser 12/15/2020 1.33  0.40 - 1.50 mg/dL Final   GFR 12/15/2020 50.50 (A) >60.00 mL/min Final   Calculated using the CKD-EPI Creatinine Equation (2021)   Calcium 12/15/2020 9.3  8.4 - 10.5 mg/dL Final   Hgb A1c MFr Bld 12/15/2020 6.1  4.6 - 6.5 % Final   Glycemic Control Guidelines for People with Diabetes:Non Diabetic:  <6%Goal of Therapy: <7%Additional Action Suggested:  >8%    Color, Urine 12/15/2020 YELLOW  Yellow;Lt. Yellow;Straw;Dark Yellow;Amber;Green;Red;Brown Final   APPearance 12/15/2020 CLEAR  Clear;Turbid;Slightly Cloudy;Cloudy Final   Specific Gravity, Urine 12/15/2020 1.020  1.000 - 1.030 Final   pH 12/15/2020 5.5  5.0 - 8.0 Final   Total Protein, Urine 12/15/2020 TRACE (A) Negative Final   Urine Glucose 12/15/2020 NEGATIVE  Negative Final   Ketones, ur 12/15/2020 NEGATIVE  Negative Final   Bilirubin Urine 12/15/2020 NEGATIVE  Negative Final   Hgb urine dipstick 12/15/2020 NEGATIVE  Negative Final   Urobilinogen, UA 12/15/2020 0.2  0.0 - 1.0 Final   Leukocytes,Ua 12/15/2020 NEGATIVE  Negative Final   Nitrite 12/15/2020 NEGATIVE  Negative Final   WBC, UA 12/15/2020 0-2/hpf  0-2/hpf Final   RBC / HPF  12/15/2020 none seen  0-2/hpf Final   Squamous Epithelial / LPF 12/15/2020 Rare(0-4/hpf)  Rare(0-4/hpf) Final   Microalb, Ur 12/15/2020 9.4 (A) 0.0 - 1.9 mg/dL Final   Creatinine,U 12/15/2020 135.3  mg/dL Final   Microalb Creat Ratio 12/15/2020 7.0  0.0 - 30.0 mg/g Final     Examination:   BP 128/84 (BP Location: Left Arm, Patient Position: Sitting, Cuff Size: Normal)   Ht '5\' 10"'  (1.778 m)   Wt 213 lb 3.2 oz (96.7 kg)   BMI 30.59 kg/m   Body mass index is 30.59 kg/m.     Diabetic Foot Exam - Simple   Simple Foot Form Diabetic Foot exam was performed with the following findings: Yes   Visual Inspection No deformities, no ulcerations, no other skin breakdown bilaterally: Yes Sensation Testing Intact to touch and monofilament testing bilaterally: Yes Pulse Check Posterior Tibialis and Dorsalis pulse intact bilaterally: Yes Comments    No ankle edema present   ASSESSMENT/ PLAN:    Diabetes type 2 on insulin See history of present illness for detailed discussion of his current management, blood sugar patterns and problems identified  Blood glucose control is excellent again with A1c 6.2  He is taking basal bolus insulin regimen with mealtime coverage using NovoLog and Tresiba Been using relatively small doses of insulin  His blood sugars at home are excellent although still has a couple of readings in the 60s He may be sometimes taking more [place] arbitrarily   Continue metformin and Actos Recommended 12 units Tresiba consistently ambulatory reduced to 10 when blood sugars are below 70 Need to consistently check readings after meals especially dinner to help adjust NovoLog dose Continue regular exercise throughout the year New meter prescription given    RENAL: Microalbumin within normal range and creatinine upper normal and stable  Hypertension: Blood pressure is well controlled and diastolic appears  to be better at home  We will continue the same  regimen  Lipids to be checked again in next visit   Follow-up in 6 months  Patient Instructions  Tresiba 12 unts, but take 10 if am sugar <70  CHECK SUGAR after supper also   Elayne Snare 12/20/2020, 8:36 AM

## 2020-12-19 NOTE — Patient Instructions (Signed)
Tresiba 12 unts, but take 10 if am sugar <70  CHECK SUGAR after supper also

## 2020-12-23 ENCOUNTER — Telehealth: Payer: Self-pay | Admitting: Endocrinology

## 2020-12-23 NOTE — Telephone Encounter (Signed)
Pt calling in voiced that the company that is supposed to send pt a meter and a meter kit has still not received the paperwork that they need  Fax number [phone]

## 2020-12-27 ENCOUNTER — Other Ambulatory Visit: Payer: Self-pay | Admitting: Endocrinology

## 2020-12-27 DIAGNOSIS — I10 Essential (primary) hypertension: Secondary | ICD-10-CM

## 2020-12-28 ENCOUNTER — Other Ambulatory Visit: Payer: Self-pay

## 2020-12-28 NOTE — Telephone Encounter (Signed)
I called patient to get clarification on what meter. No paperwork has been received. I did receive Rx request for true metrix meter. Sent Rx in for that. Patient did not answer. Left vm

## 2021-01-17 ENCOUNTER — Other Ambulatory Visit: Payer: Self-pay | Admitting: Endocrinology

## 2021-03-14 DIAGNOSIS — Z23 Encounter for immunization: Secondary | ICD-10-CM | POA: Diagnosis not present

## 2021-03-14 DIAGNOSIS — N1831 Chronic kidney disease, stage 3a: Secondary | ICD-10-CM | POA: Diagnosis not present

## 2021-03-14 DIAGNOSIS — Z794 Long term (current) use of insulin: Secondary | ICD-10-CM | POA: Diagnosis not present

## 2021-03-14 DIAGNOSIS — I10 Essential (primary) hypertension: Secondary | ICD-10-CM | POA: Diagnosis not present

## 2021-03-14 DIAGNOSIS — I2510 Atherosclerotic heart disease of native coronary artery without angina pectoris: Secondary | ICD-10-CM | POA: Diagnosis not present

## 2021-03-14 DIAGNOSIS — M109 Gout, unspecified: Secondary | ICD-10-CM | POA: Diagnosis not present

## 2021-03-14 DIAGNOSIS — E1122 Type 2 diabetes mellitus with diabetic chronic kidney disease: Secondary | ICD-10-CM | POA: Diagnosis not present

## 2021-03-28 ENCOUNTER — Other Ambulatory Visit: Payer: Self-pay | Admitting: Endocrinology

## 2021-03-28 DIAGNOSIS — E119 Type 2 diabetes mellitus without complications: Secondary | ICD-10-CM

## 2021-04-06 ENCOUNTER — Other Ambulatory Visit: Payer: Self-pay

## 2021-04-06 DIAGNOSIS — E119 Type 2 diabetes mellitus without complications: Secondary | ICD-10-CM

## 2021-04-06 MED ORDER — ACCU-CHEK GUIDE W/DEVICE KIT: PACK | 0 refills | Status: DC

## 2021-06-19 ENCOUNTER — Other Ambulatory Visit: Payer: Medicare HMO

## 2021-06-22 ENCOUNTER — Ambulatory Visit: Payer: Medicare HMO | Admitting: Endocrinology

## 2021-06-23 ENCOUNTER — Other Ambulatory Visit: Payer: Self-pay | Admitting: Endocrinology

## 2021-07-03 DIAGNOSIS — H52209 Unspecified astigmatism, unspecified eye: Secondary | ICD-10-CM | POA: Diagnosis not present

## 2021-07-03 DIAGNOSIS — H5213 Myopia, bilateral: Secondary | ICD-10-CM | POA: Diagnosis not present

## 2021-07-03 DIAGNOSIS — H524 Presbyopia: Secondary | ICD-10-CM | POA: Diagnosis not present

## 2021-07-10 ENCOUNTER — Other Ambulatory Visit: Payer: Self-pay

## 2021-07-10 ENCOUNTER — Other Ambulatory Visit (INDEPENDENT_AMBULATORY_CARE_PROVIDER_SITE_OTHER): Payer: Medicare HMO

## 2021-07-10 DIAGNOSIS — E790 Hyperuricemia without signs of inflammatory arthritis and tophaceous disease: Secondary | ICD-10-CM | POA: Diagnosis not present

## 2021-07-10 DIAGNOSIS — E119 Type 2 diabetes mellitus without complications: Secondary | ICD-10-CM

## 2021-07-10 LAB — BASIC METABOLIC PANEL
BUN: 16 mg/dL (ref 6–23)
CO2: 30 mEq/L (ref 19–32)
Calcium: 9.4 mg/dL (ref 8.4–10.5)
Chloride: 103 mEq/L (ref 96–112)
Creatinine, Ser: 1.38 mg/dL (ref 0.40–1.50)
GFR: 48.12 mL/min — ABNORMAL LOW (ref >60.00)
Glucose, Bld: 120 mg/dL — ABNORMAL HIGH (ref 70–99)
Potassium: 4.3 mEq/L (ref 3.5–5.1)
Sodium: 140 mEq/L (ref 135–145)

## 2021-07-10 LAB — HEMOGLOBIN A1C: Hgb A1c MFr Bld: 6.1 % (ref 4.6–6.5)

## 2021-07-10 LAB — URIC ACID: Uric Acid, Serum: 5 mg/dL (ref 4.0–7.8)

## 2021-07-13 ENCOUNTER — Encounter: Payer: Self-pay | Admitting: Endocrinology

## 2021-07-13 ENCOUNTER — Ambulatory Visit (INDEPENDENT_AMBULATORY_CARE_PROVIDER_SITE_OTHER): Payer: Medicare HMO | Admitting: Endocrinology

## 2021-07-13 ENCOUNTER — Other Ambulatory Visit: Payer: Self-pay

## 2021-07-13 VITALS — BP 120/84 | HR 78 | Ht 71.0 in | Wt 218.8 lb

## 2021-07-13 DIAGNOSIS — E782 Mixed hyperlipidemia: Secondary | ICD-10-CM

## 2021-07-13 DIAGNOSIS — I10 Essential (primary) hypertension: Secondary | ICD-10-CM

## 2021-07-13 DIAGNOSIS — E119 Type 2 diabetes mellitus without complications: Secondary | ICD-10-CM

## 2021-07-13 NOTE — Patient Instructions (Addendum)
Take Novolog 5-15 min before both meals ? ?Check blood sugars on waking up 3 days a week ? ?Also check blood sugars about 2 hours after meals and do this after different meals by rotation ? ?Recommended blood sugar levels on waking up are 90-130 and about 2 hours after meal is 130-160 ? ?Please bring your blood sugar monitor to each visit, thank you ? ?Look into Gym ? ?

## 2021-07-13 NOTE — Progress Notes (Signed)
Patient ID: Eric Grant, male   DOB: November 29, 1940, 81 y.o.   MRN: [id number]     Reason for Appointment: Follow-up, 107-monthvisit  History of Present Illness    Type 2 DIABETES MELITUS, date of diagnosis:  1988    Previous history: He has been on basal bolus insulin regimen for several years with excellent control He takes mealtime insulin only at breakfast and lunch Also has been taking Actoplusmet with good results for several years and no side effects  Recent history:  Insulin regimen:   TRESIBA 14 units in morning NOVOLOG insulin insulin 4 units in morning and 14 at supper  His A1c again is excellent and now 6.1, usually about the same   Oral hypoglycemic drugs: metformin 850 twice a day, Actos 15 mg daily  Current blood sugar patterns, management and problems identified: He has checked blood sugars mostly fasting and not consistently also  Most of his fasting blood sugars are just above 100  However recently has only a couple of readings in the evenings and these appear to be after dinner  He now says that sometimes he will take his evening NovoLog after eating and this may be making his sugars higher at times  Because of his knee pain he has not done much exercise or walking  His weight is generally about the same with some fluctuation  As before he usually takes the same insulin for his meals without adjusting the dose for meal size No significant hypoglycemia       Monitors blood glucose: .Marland Kitchen   Glucometer:  Accu-Chek       Blood Glucose readings from meter review, download not enabled:   PRE-MEAL Fasting Lunch Dinner Bedtime Overall  Glucose range: 67-122      Mean/median:     113   POST-MEAL PC Breakfast PC Lunch PC Dinner  Glucose range:   185, 208  Mean/median:      PREVIOUS   FASTING range 61-194, average 104 for 30 days   Exercise: Some yard work and walking, limited by knee pain   Weight control:    Wt Readings from Last 3 Encounters:   07/13/21 218 lb 12.8 oz (99.2 kg)  12/19/20 213 lb 3.2 oz (96.7 kg)  08/23/20 221 lb 6.4 oz (100.4 kg)         Diabetes labs:  Lab Results  Component Value Date   HGBA1C 6.1 07/10/2021   HGBA1C 6.2 (A) 12/19/2020   HGBA1C 6.1 12/15/2020   Lab Results  Component Value Date   MICROALBUR 9.4 (H) 12/15/2020   LDLCALC 44 08/17/2020   CREATININE 1.38 07/10/2021    OTHER active problems: See review of systems   Allergies as of 07/13/2021   No Known Allergies      Medication List        Accurate as of July 13, 2021 10:07 AM. If you have any questions, ask your nurse or doctor.          Accu-Chek Aviva Soln Use as instructed.   True Metrix Level 1 Low Soln USE TO CALIBRATE GLUCOMETER AS DIRECTED   Accu-Chek FastClix Lancets Misc USE  TO CHECK BLOOD SUGAR FOUR TIMES DAILY   TRUEplus Lancets 28G Misc TEST BLOOD SUGAR FOUR TIMES DAILY   Accu-Chek Guide w/Device Kit Use to check blood sugar daily   albuterol 108 (90 Base) MCG/ACT inhaler Commonly known as: VENTOLIN HFA 2 puff as needed   Alcohol Swabs 70 % Pads Use  as directed   allopurinol 100 MG tablet Commonly known as: ZYLOPRIM TAKE 1 TABLET EVERY DAY   aspirin 81 MG tablet Take 81 mg by mouth daily.   B-D ULTRAFINE III SHORT PEN 31G X 8 MM Misc Generic drug: Insulin Pen Needle 1 each by Other route 3 (three) times daily. E11.9   carvedilol 12.5 MG tablet Commonly known as: COREG TAKE 1 TABLET TWICE DAILY WITH A MEAL   cholecalciferol 10 MCG (400 UNIT) Tabs tablet Commonly known as: VITAMIN D3 Take 400 Units by mouth.   metFORMIN 850 MG tablet Commonly known as: GLUCOPHAGE TAKE 1 TABLET TWICE DAILY WITH MEALS   NovoLOG FlexPen 100 UNIT/ML FlexPen Generic drug: insulin aspart INJECT 4 UNITS SUBCUTANEOUSLY AT BREAKFAST AND 14 TO 15 UNITS  AT SUPPER   olmesartan 20 MG tablet Commonly known as: BENICAR TAKE 1 TABLET EVERY DAY   pioglitazone 15 MG tablet Commonly known as: ACTOS Take 1  tablet by mouth once daily   rosuvastatin 20 MG tablet Commonly known as: CRESTOR TAKE 1 TABLET EVERY DAY   Tandem 162-115.2 MG Caps capsule Generic drug: ferrous fumarate-iron polysaccharide complex TAKE 2 CAPSULES ONE TIME DAILY   Tresiba FlexTouch 100 UNIT/ML FlexTouch Pen Generic drug: insulin degludec INJECT 14 UNITS UNDER THE SKIN 1 TIME DAILY   True Metrix Blood Glucose Test test strip Generic drug: glucose blood TEST FOUR TIMES DAILY        Allergies: No Known Allergies  Past Medical History:  Diagnosis Date   Seizures ([place]) 1991   Urinary tract infection 07/2012    No past surgical history on file.  Family History  Problem Relation Age of Onset   Hyperlipidemia Mother    Diabetes Mother    Hypertension Mother    Diabetes Father    Hypertension Father    Cancer Neg Hx     Social History:  reports that he has never smoked. He has never used smokeless tobacco. No history on file for alcohol use and drug use.  Review of Systems:   Hypertension:  has had long-standing hypertension, Is on Benicar 20 and carvedilol Blood pressure tends to be higher in the office at times  Blood pressure is usually controlled  BP checked periodically at home, recently reading about 140s/<80   BP Readings from Last 3 Encounters:  07/13/21 120/84  12/19/20 128/84  08/23/20 (!) 160/90   RENAL dysfunction: His GFR is recently about 41, has some fluctuation  No history of microalbuminuria  Lab Results  Component Value Date   CREATININE 1.38 07/10/2021   CREATININE 1.33 12/15/2020   CREATININE 1.29 08/17/2020     Lipids: LDL is excellent  with his taking Crestor 20 mg, last LDL as follows  Lab Results  Component Value Date   CHOL 97 08/17/2020   HDL 37.90 (L) 08/17/2020   LDLCALC 44 08/17/2020   TRIG 74.0 08/17/2020   CHOLHDL 3 08/17/2020      CARDIAC history:  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40 - 1.50 mg/dL Final   GFR 07/10/2021 48.12 (L)  >60.00 mL/min Final   Calculated using the CKD-EPI Creatinine Equation (2021)   Calcium 07/10/2021 9.4  8.4 - 10.5 mg/dL Final   Hgb A1c MFr Bld 07/10/2021 6.1  4.6 - 6.5 % Final   Glycemic Control Guidelines for People with Diabetes:Non Diabetic:  <6%Goal of Therapy: <7%Additional Action Suggested:  >8%      Examination:   BP 120/84    Pulse 78    Ht '5\' 11"'  (1.803 m)    Wt 218 lb 12.8 oz (99.2 kg)    SpO2 94%    BMI 30.52 kg/m   Body mass index is 30.52 kg/m.   Abdominal obesity present   ASSESSMENT/ PLAN:    Diabetes type 2 on insulin See history of present illness for detailed discussion of his current management, blood sugar patterns and problems identified  Blood glucose control is excellent again with A1c of 6.1  He is taking basal bolus insulin regimen with mealtime coverage using NovoLog and Tresiba Blood sugar monitoring is somewhat erratic and mostly in the mornings His main difficulty is not taking the NovoLog before dinnertime consistently and has readings as high as 208 No hypoglycemia except rarely low normal fasting readings Discussed timing of mealtime insulin especially to avoid both higher postprandial readings and late hypoglycemia  Also need to adjust at least his supper dose +/- 2 units for meal size and carbohydrate content Although his BMI is about 30 he will need  to better with his exercise regimen and will benefit from weight loss He can try to go to the [facility] to do nonweightbearing exercises   RENAL: Microalbumin within normal range and creatinine upper normal and stable  Hypertension: Blood pressure is well controlled  Tends to have somewhat higher readings in the office and he will continue to monitor at home  History of gout and hyperuricemia: Uric acid is controlled  Lipids to be checked by his PCP   Follow-up in 6 months  There are no Patient Instructions on file for this visit.   Elayne Snare 07/13/2021, 10:07 AM

## 2021-07-18 ENCOUNTER — Other Ambulatory Visit: Payer: Self-pay | Admitting: Endocrinology

## 2021-07-26 DIAGNOSIS — I2510 Atherosclerotic heart disease of native coronary artery without angina pectoris: Secondary | ICD-10-CM | POA: Diagnosis not present

## 2021-07-26 DIAGNOSIS — Z0000 Encounter for general adult medical examination without abnormal findings: Secondary | ICD-10-CM | POA: Diagnosis not present

## 2021-07-26 DIAGNOSIS — Z1389 Encounter for screening for other disorder: Secondary | ICD-10-CM | POA: Diagnosis not present

## 2021-07-26 DIAGNOSIS — M109 Gout, unspecified: Secondary | ICD-10-CM | POA: Diagnosis not present

## 2021-07-26 DIAGNOSIS — E7800 Pure hypercholesterolemia, unspecified: Secondary | ICD-10-CM | POA: Diagnosis not present

## 2021-07-26 DIAGNOSIS — B351 Tinea unguium: Secondary | ICD-10-CM | POA: Diagnosis not present

## 2021-07-26 DIAGNOSIS — Z794 Long term (current) use of insulin: Secondary | ICD-10-CM | POA: Diagnosis not present

## 2021-07-26 DIAGNOSIS — I10 Essential (primary) hypertension: Secondary | ICD-10-CM | POA: Diagnosis not present

## 2021-07-26 DIAGNOSIS — E1122 Type 2 diabetes mellitus with diabetic chronic kidney disease: Secondary | ICD-10-CM | POA: Diagnosis not present

## 2021-07-26 DIAGNOSIS — N1831 Chronic kidney disease, stage 3a: Secondary | ICD-10-CM | POA: Diagnosis not present

## 2021-08-07 ENCOUNTER — Encounter: Payer: Self-pay | Admitting: Podiatrist

## 2021-08-07 ENCOUNTER — Ambulatory Visit: Payer: Medicare HMO | Admitting: Podiatrist

## 2021-08-07 DIAGNOSIS — B351 Tinea unguium: Secondary | ICD-10-CM

## 2021-08-07 DIAGNOSIS — M79609 Pain in unspecified limb: Secondary | ICD-10-CM | POA: Diagnosis not present

## 2021-08-07 NOTE — Patient Instructions (Signed)

## 2021-08-07 NOTE — Progress Notes (Signed)
Subjective: ?Eric Grant is a 81 y.o. male patient with history of diabetes who presents to office today complaining of long,mildly painful nails  while ambulating in shoes; unable to trim. Patient denies any new cramping, numbness, burning or tingling in the legs. ? ?Referring Provider :  [name], MD ? ? ?Patient Active Problem List  ? Diagnosis Date Noted  ? Urinary tract infection 07/05/2012  ? DYSLIPIDEMIA 03/03/2009  ? CAD 03/03/2009  ? Diabetes mellitus, stable ([place]) 03/02/2009  ? ANEMIA 03/02/2009  ? HYPERTENSION 03/02/2009  ? ?Current Outpatient Medications on File Prior to Visit  ?Medication Sig Dispense Refill  ? Accu-Chek FastClix Lancets MISC USE  TO CHECK BLOOD SUGAR FOUR TIMES DAILY 400 each 2  ? albuterol (VENTOLIN HFA) 108 (90 Base) MCG/ACT inhaler 2 puff as needed    ? Alcohol Swabs 70 % PADS Use as directed 400 each 1  ? allopurinol (ZYLOPRIM) 100 MG tablet TAKE 1 TABLET EVERY DAY 90 tablet 1  ? aspirin 81 MG tablet Take 81 mg by mouth daily. (Patient not taking: Reported on 07/13/2021)    ? Blood Glucose Calibration (ACCU-CHEK AVIVA) SOLN Use as instructed. 1 each 0  ? Blood Glucose Calibration (TRUE METRIX LEVEL 1) Low SOLN USE TO CALIBRATE GLUCOMETER AS DIRECTED (Patient not taking: Reported on 07/13/2021) 1 each 0  ? Blood Glucose Monitoring Suppl (ACCU-CHEK GUIDE) w/Device KIT Use to check blood sugar daily 1 kit 0  ? carvedilol (COREG) 12.5 MG tablet TAKE 1 TABLET TWICE DAILY WITH A MEAL 180 tablet 1  ? cholecalciferol (VITAMIN D) 400 UNITS TABS tablet Take 400 Units by mouth. (Patient not taking: Reported on 07/13/2021)    ? Insulin Pen Needle (B-D ULTRAFINE III SHORT PEN) 31G X 8 MM MISC 1 each by Other route 3 (three) times daily. E11.9 300 each 2  ? metFORMIN (GLUCOPHAGE) 850 MG tablet TAKE 1 TABLET TWICE DAILY WITH MEALS 180 tablet 2  ? NOVOLOG FLEXPEN 100 UNIT/ML FlexPen INJECT 4 UNITS SUBCUTANEOUSLY AT BREAKFAST AND 14 TO 15 UNITS  AT SUPPER 30 mL 2  ? olmesartan (BENICAR) 20 MG  tablet TAKE 1 TABLET EVERY DAY 90 tablet 0  ? pioglitazone (ACTOS) 15 MG tablet Take 1 tablet by mouth once daily 90 tablet 0  ? rosuvastatin (CRESTOR) 20 MG tablet TAKE 1 TABLET EVERY DAY 90 tablet 1  ? TANDEM 162-115.2 MG CAPS capsule TAKE 2 CAPSULES ONE TIME DAILY (Patient not taking: Reported on 07/13/2021) 180 capsule 1  ? TRESIBA FLEXTOUCH 100 UNIT/ML FlexTouch Pen INJECT 14 UNITS UNDER THE SKIN 1 TIME DAILY 15 mL 1  ? TRUE METRIX BLOOD GLUCOSE TEST test strip TEST FOUR TIMES DAILY (Patient not taking: Reported on 07/13/2021) 400 strip 1  ? TRUEplus Lancets 28G MISC TEST BLOOD SUGAR FOUR TIMES DAILY 200 each 3  ? ?No current facility-administered medications on file prior to visit.  ? ?No Known Allergies ? ?Recent Results (from the past 2160 hour(s))  ?Uric acid     Status: None  ? Collection Time: 07/10/21  9:36 AM  ?Result Value Ref Range  ? Uric Acid, Serum 5.0 4.0 - 7.8 mg/dL  ?Basic metabolic panel     Status: Abnormal  ? Collection Time: 07/10/21  9:36 AM  ?Result Value Ref Range  ? Sodium 140 135 - 145 mEq/L  ? Potassium 4.3 3.5 - 5.1 mEq/L  ? Chloride 103 96 - 112 mEq/L  ? CO2 30 19 - 32 mEq/L  ? Glucose, Bld 120 (H)  70 - 99 mg/dL  ? BUN 16 6 - 23 mg/dL  ? Creatinine, Ser 1.38 0.40 - 1.50 mg/dL  ? GFR 48.12 (L) >60.00 mL/min  ?  Comment: Calculated using the CKD-EPI Creatinine Equation (2021)  ? Calcium 9.4 8.4 - 10.5 mg/dL  ?Hemoglobin A1c     Status: None  ? Collection Time: 07/10/21  9:36 AM  ?Result Value Ref Range  ? Hgb A1c MFr Bld 6.1 4.6 - 6.5 %  ?  Comment: Glycemic Control Guidelines for People with Diabetes:Non Diabetic:  <6%Goal of Therapy: <7%Additional Action Suggested:  >8%   ? ? ?Objective: ?General: Patient is awake, alert, and oriented x 3 and in no acute distress. ? ?Integument: Skin is warm, dry and supple bilateral. Nails are tender, long, thickened and  ?dystrophic with subungual debris, consistent with onychomycosis, 1-5 bilateral. No signs of infection. No open lesions or  preulcerative lesions present bilateral. Remaining integument unremarkable. ? ?Vasculature:  Dorsalis Pedis pulse1 /4 bilateral. Posterior Tibial pulse  1/4 bilateral.  ?Capillary fill time <3 sec 1-5 bilateral. Positive hair growth to the level of the digits. ?Temperature gradient within normal limits. No varicosities present bilateral. No edema present bilateral.  ? ?Neurology: The patient has intact sensation measured with a 5.07/10g Semmes Weinstein Monofilament at all pedal sites bilateral . Vibratory sensation intact bilateral with tuning fork. No Babinski sign present bilateral.  ? ?Musculoskeletal: pes planus foot type is noted. Muscular strength 5/5 in all lower extremity muscular groups bilateral without pain on range of motion . No tenderness with calf compression bilateral. ? ?Assessment and Plan: ?  ICD-10-CM   ?1. Pain due to onychomycosis of nail  B35.1   ? M79.609   ?  ? ? ? ?-Examined patient. ?-Discussed and educated patient on diabetic foot care, especially with  ?regards to the vascular, neurological and musculoskeletal systems.  ?-Stressed the importance of good glycemic control and the detriment of not  ?controlling glucose levels in relation to the foot. ?-Mechanically debrided all nails 1-5 bilateral using sterile nail nipper and filed with dremel without incident  ?-Answered all patient questions ?-Patient to return  in 3 months for at risk foot care ?-Patient advised to call the office if any problems or questions arise in the meantime. ? ?Bronson Ing, DPM ?

## 2021-08-10 NOTE — Progress Notes (Deleted)
?  ?Cardiology Office Note ? ? ?Date:  08/10/2021  ? ?ID:  Eric Grant, DOB 1940-10-17, MRN [id number] ? ?PCP:  [name], MD  ?Cardiologist:   Alliana Mcauliff Martinique, MD  ? ?No chief complaint on file. ? ? ?  ?History of Present Illness: ?Eric Grant is a 81 y.o. male who is seen at the request of Dr Lysle Rubens for evaluation of CAD. Seen remotely by [name] in 2011. Stress Myoview in 2007 showed inferolateral scar with EF 34%. Echo in 2011 showed EF 40% with inferior and inferolateral akinesis. Patient apparently refused cardiac cath in past. He has a history of IDDM, HTN, and HLD.  ? ? ? ?Past Medical History:  ?Diagnosis Date  ? Seizures ([place]) 1991  ? Urinary tract infection 07/2012  ? ? ?No past surgical history on file. ? ? ?Current Outpatient Medications  ?Medication Sig Dispense Refill  ? Accu-Chek FastClix Lancets MISC USE  TO CHECK BLOOD SUGAR FOUR TIMES DAILY 400 each 2  ? albuterol (VENTOLIN HFA) 108 (90 Base) MCG/ACT inhaler 2 puff as needed    ? Alcohol Swabs 70 % PADS Use as directed 400 each 1  ? allopurinol (ZYLOPRIM) 100 MG tablet TAKE 1 TABLET EVERY DAY 90 tablet 1  ? aspirin 81 MG tablet Take 81 mg by mouth daily. (Patient not taking: Reported on 07/13/2021)    ? Blood Glucose Calibration (ACCU-CHEK AVIVA) SOLN Use as instructed. 1 each 0  ? Blood Glucose Calibration (TRUE METRIX LEVEL 1) Low SOLN USE TO CALIBRATE GLUCOMETER AS DIRECTED (Patient not taking: Reported on 07/13/2021) 1 each 0  ? Blood Glucose Monitoring Suppl (ACCU-CHEK GUIDE) w/Device KIT Use to check blood sugar daily 1 kit 0  ? carvedilol (COREG) 12.5 MG tablet TAKE 1 TABLET TWICE DAILY WITH A MEAL 180 tablet 1  ? cholecalciferol (VITAMIN D) 400 UNITS TABS tablet Take 400 Units by mouth. (Patient not taking: Reported on 07/13/2021)    ? Insulin Pen Needle (B-D ULTRAFINE III SHORT PEN) 31G X 8 MM MISC 1 each by Other route 3 (three) times daily. E11.9 300 each 2  ? metFORMIN (GLUCOPHAGE) 850 MG tablet TAKE 1 TABLET TWICE DAILY WITH MEALS  180 tablet 2  ? NOVOLOG FLEXPEN 100 UNIT/ML FlexPen INJECT 4 UNITS SUBCUTANEOUSLY AT BREAKFAST AND 14 TO 15 UNITS  AT SUPPER 30 mL 2  ? olmesartan (BENICAR) 20 MG tablet TAKE 1 TABLET EVERY DAY 90 tablet 0  ? pioglitazone (ACTOS) 15 MG tablet Take 1 tablet by mouth once daily 90 tablet 0  ? rosuvastatin (CRESTOR) 20 MG tablet TAKE 1 TABLET EVERY DAY 90 tablet 1  ? TANDEM 162-115.2 MG CAPS capsule TAKE 2 CAPSULES ONE TIME DAILY (Patient not taking: Reported on 07/13/2021) 180 capsule 1  ? TRESIBA FLEXTOUCH 100 UNIT/ML FlexTouch Pen INJECT 14 UNITS UNDER THE SKIN 1 TIME DAILY 15 mL 1  ? TRUE METRIX BLOOD GLUCOSE TEST test strip TEST FOUR TIMES DAILY (Patient not taking: Reported on 07/13/2021) 400 strip 1  ? TRUEplus Lancets 28G MISC TEST BLOOD SUGAR FOUR TIMES DAILY 200 each 3  ? ?No current facility-administered medications for this visit.  ? ? ?Allergies:   Patient has no known allergies.  ? ? ?Social History:  The patient  reports that he has never smoked. He has never used smokeless tobacco.  ? ?Family History:  The patient's ***family history includes Diabetes in his father and mother; Hyperlipidemia in his mother; Hypertension in his father and mother.  ? ? ?ROS:  Please see the history of present illness.   Otherwise, review of systems are positive for {NONE DEFAULTED:18576}.   All other systems are reviewed and negative.  ? ? ?PHYSICAL EXAM: ?VS:  There were no vitals taken for this visit. , BMI There is no height or weight on file to calculate BMI. ?GEN: Well nourished, well developed, in no acute distress ?HEENT: normal ?Neck: no JVD, carotid bruits, or masses ?Cardiac: ***RRR; no murmurs, rubs, or gallops,no edema  ?Respiratory:  clear to auscultation bilaterally, normal work of breathing ?GI: soft, nontender, nondistended, + BS ?MS: no deformity or atrophy ?Skin: warm and dry, no rash ?Neuro:  Strength and sensation are intact ?Psych: euthymic mood, full affect ? ? ?EKG:  EKG {ACTION; IS/IS [id number]}  ordered today. ?The ekg ordered today demonstrates *** ? ? ?Recent Labs: ?08/17/2020: ALT 10; Hemoglobin 11.8; Platelets 233.0 ?07/10/2021: BUN 16; Creatinine, Ser 1.38; Potassium 4.3; Sodium 140  ? ? ?Lipid Panel ?   ?Component Value Date/Time  ? CHOL 97 08/17/2020 1347  ? TRIG 74.0 08/17/2020 1347  ? HDL 37.90 (L) 08/17/2020 1347  ? CHOLHDL 3 08/17/2020 1347  ? VLDL 14.8 08/17/2020 1347  ? LDLCALC 44 08/17/2020 1347  ? ?  ? ?Wt Readings from Last 3 Encounters:  ?07/13/21 218 lb 12.8 oz (99.2 kg)  ?12/19/20 213 lb 3.2 oz (96.7 kg)  ?08/23/20 221 lb 6.4 oz (100.4 kg)  ?  ? ? ?Other studies Reviewed: ?Additional studies/ records that were reviewed today include: ***. ?Review of the above records demonstrates: *** ? ? ?ASSESSMENT AND PLAN: ? ?1.  *** ? ? ?Current medicines are reviewed at length with the patient today.  The patient {ACTIONS; HAS/DOES NOT HAVE:19233} concerns regarding medicines. ? ?The following changes have been made:  {PLAN; NO CHANGE:13088:s} ? ?Labs/ tests ordered today include: *** ?No orders of the defined types were placed in this encounter. ? ? ?   ? ? ?Disposition:   FU with *** in {gen number 2-24:[id number]} {Days to years:10300} ? ?Signed, ?Billy Rocco Martinique, MD  ?08/10/2021 7:52 AM    ?[facility] ?[address] [place], [place], [address] ?Phone [phone], Fax [phone] ? ? ?

## 2021-08-16 ENCOUNTER — Other Ambulatory Visit: Payer: Self-pay | Admitting: Endocrinology

## 2021-08-16 ENCOUNTER — Ambulatory Visit: Payer: Medicare HMO | Admitting: Cardiology

## 2021-08-19 NOTE — Progress Notes (Deleted)
Cardiology Office Note   Date:  08/19/2021   ID:  Eric Grant, DOB October 13, 1940, MRN [id number]  PCP:  [name], MD  Cardiologist:   Quintasha Gren [place], MD   No chief complaint on file.     History of Present Illness: Eric Grant is a 81 y.o. male who is seen at the request of Dr Lysle Rubens for evaluation of CAD. He had a Myoview study in 2007 showing inferolateral scar and EF 34%. He had an Echo in 2011 showing inferior and inferolateral scar with EF 35-40%. Patient deferred cardiac cath in the past. Last seen by [name] in 2010. He has a history of DM type 2 on insulin, HTN, HLD and CKD.     Past Medical History:  Diagnosis Date   Seizures (Eric Grant) 1991   Urinary tract infection 07/2012    No past surgical history on file.   Current Outpatient Medications  Medication Sig Dispense Refill   Accu-Chek FastClix Lancets MISC USE  TO CHECK BLOOD SUGAR FOUR TIMES DAILY 400 each 2   albuterol (VENTOLIN HFA) 108 (90 Base) MCG/ACT inhaler 2 puff as needed     Alcohol Swabs 70 % PADS Use as directed 400 each 1   allopurinol (ZYLOPRIM) 100 MG tablet TAKE 1 TABLET EVERY DAY 90 tablet 1   aspirin 81 MG tablet Take 81 mg by mouth daily. (Patient not taking: Reported on 07/13/2021)     Blood Glucose Calibration (ACCU-CHEK AVIVA) SOLN Use as instructed. 1 each 0   Blood Glucose Calibration (TRUE METRIX LEVEL 1) Low SOLN USE TO CALIBRATE GLUCOMETER AS DIRECTED (Patient not taking: Reported on 07/13/2021) 1 each 0   Blood Glucose Monitoring Suppl (ACCU-CHEK GUIDE) w/Device KIT Use to check blood sugar daily 1 kit 0   carvedilol (COREG) 12.5 MG tablet TAKE 1 TABLET TWICE DAILY WITH A MEAL 180 tablet 1   cholecalciferol (VITAMIN D) 400 UNITS TABS tablet Take 400 Units by mouth. (Patient not taking: Reported on 07/13/2021)     Insulin Pen Needle (B-D ULTRAFINE III SHORT PEN) 31G X 8 MM MISC 1 each by Other route 3 (three) times daily. E11.9 300 each 2   metFORMIN (GLUCOPHAGE) 850 MG tablet TAKE 1  TABLET TWICE DAILY WITH MEALS 180 tablet 2   NOVOLOG FLEXPEN 100 UNIT/ML FlexPen INJECT 4 UNITS SUBCUTANEOUSLY AT BREAKFAST AND 14 TO 15 UNITS  AT SUPPER 30 mL 2   olmesartan (BENICAR) 20 MG tablet TAKE 1 TABLET EVERY DAY 90 tablet 0   pioglitazone (ACTOS) 15 MG tablet Take 1 tablet by mouth once daily 90 tablet 0   rosuvastatin (CRESTOR) 20 MG tablet TAKE 1 TABLET EVERY DAY 90 tablet 1   TANDEM 162-115.2 MG CAPS capsule TAKE 2 CAPSULES ONE TIME DAILY (Patient not taking: Reported on 07/13/2021) 180 capsule 1   TRESIBA FLEXTOUCH 100 UNIT/ML FlexTouch Pen INJECT 14 UNITS UNDER THE SKIN 1 TIME DAILY 15 mL 1   TRUE METRIX BLOOD GLUCOSE TEST test strip TEST FOUR TIMES DAILY (Patient not taking: Reported on 07/13/2021) 400 strip 1   TRUEplus Lancets 28G MISC TEST BLOOD SUGAR FOUR TIMES DAILY 200 each 3   No current facility-administered medications for this visit.    Allergies:   Patient has no known allergies.    Social History:  The patient  reports that he has never smoked. He has never used smokeless tobacco.   Family History:  The patient's ***family history includes Diabetes in his father and mother; Hyperlipidemia in his  mother; Hypertension in his father and mother.    ROS:  Please see the history of present illness.   Otherwise, review of systems are positive for {NONE DEFAULTED:18576}.   All other systems are reviewed and negative.    PHYSICAL EXAM: VS:  There were no vitals taken for this visit. , BMI There is no height or weight on file to calculate BMI. GEN: Well nourished, well developed, in no acute distress HEENT: normal Neck: no JVD, carotid bruits, or masses Cardiac: ***RRR; no murmurs, rubs, or gallops,no edema  Respiratory:  clear to auscultation bilaterally, normal work of breathing GI: soft, nontender, nondistended, + BS MS: no deformity or atrophy Skin: warm and dry, no rash Neuro:  Strength and sensation are intact Psych: euthymic mood, full affect   EKG:  EKG  {ACTION; IS/IS UYQ:[id number]} ordered today. The ekg ordered today demonstrates ***   Recent Labs: 07/10/2021: BUN 16; Creatinine, Ser 1.38; Potassium 4.3; Sodium 140    Lipid Panel    Component Value Date/Time   CHOL 97 08/17/2020 1347   TRIG 74.0 08/17/2020 1347   HDL 37.90 (L) 08/17/2020 1347   CHOLHDL 3 08/17/2020 1347   VLDL 14.8 08/17/2020 1347   LDLCALC 44 08/17/2020 1347      Wt Readings from Last 3 Encounters:  07/13/21 218 lb 12.8 oz (99.2 kg)  12/19/20 213 lb 3.2 oz (96.7 kg)  08/23/20 221 lb 6.4 oz (100.4 kg)      Other studies Reviewed: Additional studies/ records that were reviewed today include: ***. Review of the above records demonstrates: ***   ASSESSMENT AND PLAN:  1.  ***   Current medicines are reviewed at length with the patient today.  The patient {ACTIONS; HAS/DOES NOT HAVE:19233} concerns regarding medicines.  The following changes have been made:  {PLAN; NO CHANGE:13088:s}  Labs/ tests ordered today include: *** No orders of the defined types were placed in this encounter.        Disposition:   FU with *** in {gen number [id number]:[id number]} {Days to years:10300}  Signed, Yehuda Printup [place], MD  08/19/2021 3:23 PM    [facility] [address] [place], [place], [address] Phone [phone], Fax [phone]

## 2021-08-24 ENCOUNTER — Ambulatory Visit: Payer: Medicare HMO | Admitting: Cardiology

## 2021-09-04 ENCOUNTER — Other Ambulatory Visit: Payer: Self-pay | Admitting: Endocrinology

## 2021-09-04 DIAGNOSIS — E119 Type 2 diabetes mellitus without complications: Secondary | ICD-10-CM

## 2021-09-05 ENCOUNTER — Other Ambulatory Visit: Payer: Self-pay

## 2021-09-05 DIAGNOSIS — E119 Type 2 diabetes mellitus without complications: Secondary | ICD-10-CM

## 2021-09-05 MED ORDER — DROPLET PEN NEEDLES 31G X 8 MM MISC: 2 refills | Status: AC

## 2021-09-19 NOTE — Progress Notes (Signed)
Cardiology Office Note   Date:  09/25/2021   ID:  Eric Grant, DOB [birth year], MRN [id number]  PCP:  [name], MD  Cardiologist:   Zaina Jenkin [place], MD   Chief Complaint  Patient presents with   New Patient (Initial Visit)    CAD.   Coronary Artery Disease      History of Present Illness: Eric Grant is a 81 y.o. male who is seen at the request of Dr Lysle Rubens for evaluation of CAD. He had a Myoview study in 2007 showing inferolateral scar and EF 34%. He had an Echo in 2011 showing inferior and inferolateral scar with EF 35-40%. Patient deferred cardiac cath in the past. Last seen by [name] in 2010. He has a history of DM type 2 on insulin, HTN, HLD and CKD.   He reports that he has more SOB in the past year. Denies any chest pain. No palpitations or edema. Breathing is worse when the pollen is bad. No dizziness or syncope. Stays active maintaining 3 yards. Retired from [employer].     Past Medical History:  Diagnosis Date   Seizures ([place]) 1991   Urinary tract infection 07/2012    History reviewed. No pertinent surgical history.   Current Outpatient Medications  Medication Sig Dispense Refill   Accu-Chek FastClix Lancets MISC USE  TO CHECK BLOOD SUGAR FOUR TIMES DAILY 400 each 2   albuterol (VENTOLIN HFA) 108 (90 Base) MCG/ACT inhaler 2 puff as needed     Alcohol Swabs 70 % PADS Use as directed 400 each 1   allopurinol (ZYLOPRIM) 100 MG tablet TAKE 1 TABLET EVERY DAY 90 tablet 1   aspirin 81 MG tablet Take 81 mg by mouth daily.     Blood Glucose Calibration (ACCU-CHEK AVIVA) SOLN Use as instructed. 1 each 0   Blood Glucose Calibration (TRUE METRIX LEVEL 1) Low SOLN USE TO CALIBRATE GLUCOMETER AS DIRECTED 1 each 0   Blood Glucose Monitoring Suppl (ACCU-CHEK GUIDE) w/Device KIT Use to check blood sugar daily 1 kit 0   carvedilol (COREG) 12.5 MG tablet TAKE 1 TABLET TWICE DAILY WITH A MEAL 180 tablet 1   Insulin Pen Needle (DROPLET PEN NEEDLES) 31G X 8 MM MISC USE 3  DAILY 300 each 2   metFORMIN (GLUCOPHAGE) 850 MG tablet TAKE 1 TABLET TWICE DAILY WITH MEALS 180 tablet 2   NOVOLOG FLEXPEN 100 UNIT/ML FlexPen INJECT 4 UNITS SUBCUTANEOUSLY AT BREAKFAST AND 14 TO 15 UNITS  AT SUPPER 30 mL 2   olmesartan (BENICAR) 20 MG tablet TAKE 1 TABLET EVERY DAY 90 tablet 0   pioglitazone (ACTOS) 15 MG tablet Take 1 tablet by mouth once daily 90 tablet 0   rosuvastatin (CRESTOR) 20 MG tablet TAKE 1 TABLET EVERY DAY 90 tablet 1   TRESIBA FLEXTOUCH 100 UNIT/ML FlexTouch Pen INJECT 14 UNITS UNDER THE SKIN 1 TIME DAILY 15 mL 1   TRUE METRIX BLOOD GLUCOSE TEST test strip TEST FOUR TIMES DAILY 400 strip 1   TRUEplus Lancets 28G MISC TEST BLOOD SUGAR FOUR TIMES DAILY 200 each 3   No current facility-administered medications for this visit.    Allergies:   Patient has no known allergies.    Social History:  The patient  reports that he has never smoked. He has never used smokeless tobacco.   Family History:  The patient's family history includes Diabetes in his father and mother; Hyperlipidemia in his mother; Hypertension in his father and mother.    ROS:  Please  see the history of present illness.   Otherwise, review of systems are positive for none.   All other systems are reviewed and negative.    PHYSICAL EXAM: VS:  BP 122/76 (BP Location: Left Arm, Patient Position: Sitting, Cuff Size: Normal)   Pulse 93   Ht '5\' 11"'  (1.803 m)   Wt 215 lb (97.5 kg)   BMI 29.99 kg/m  , BMI Body mass index is 29.99 kg/m. GEN: Well nourished, well developed, in no acute distress HEENT: normal Neck: no JVD, carotid bruits, or masses Cardiac: RRR; gr 2/6 holosystolic murmur LSB and apex. Trace edema. Respiratory:  clear to auscultation bilaterally, normal work of breathing GI: soft, nontender, nondistended, + BS MS: no deformity or atrophy Skin: warm and dry, no rash Neuro:  Strength and sensation are intact Psych: euthymic mood, full affect   EKG:  EKG is ordered today. The  ekg ordered today demonstrates NSR with occ PVC. LVH with QRS widening. Diffuse T wave inversion- increased from 2015. I have personally reviewed and interpreted this study.    Recent Labs: 07/10/2021: BUN 16; Creatinine, Ser 1.38; Potassium 4.3; Sodium 140 A1c 6.1%    Lipid Panel    Component Value Date/Time   CHOL 97 08/17/2020 1347   TRIG 74.0 08/17/2020 1347   HDL 37.90 (L) 08/17/2020 1347   CHOLHDL 3 08/17/2020 1347   VLDL 14.8 08/17/2020 1347   LDLCALC 44 08/17/2020 1347      Wt Readings from Last 3 Encounters:  09/25/21 215 lb (97.5 kg)  07/13/21 218 lb 12.8 oz (99.2 kg)  12/19/20 213 lb 3.2 oz (96.7 kg)      Other studies Reviewed: Additional studies/ records that were reviewed today include: see HPI.    ASSESSMENT AND PLAN:  1.  CAD. 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 No significant chest pain but does note some dyspnea. On Coreg, ASA, statin. Will update Echo. If EF down from prior may need cardiac cath.  2. Chronic systolic CHF. Prior EF 35%. Some increased dyspnea over the past year. Need to update Echo to reassess LV function. Currently on Coreg and Benicar. Consideration would be to switch ARB to Entresto, add aldactone or SGLT 2 inhibitor. If EF is poor may need to come off Actos. Will follow up post Echo 3. Murmur c/w MR on exam. Will check with Echo 4. HTN well controlled. 5. HLD excellent control on statin 6. DM type 2 on insulin. Consider adding SGLT 2 inhibitor. 7. CKD stage 3a  Current medicines are reviewed at length with the patient today.  The patient does not have concerns regarding medicines.  The following changes have been made:  no change  Labs/ tests ordered today include:   Orders Placed This Encounter  Procedures   EKG 12-Lead   ECHOCARDIOGRAM COMPLETE         Disposition:   FU with me after Echo  Signed, Mancil Pfenning [place], MD  09/25/2021 8:57 AM    [facility] [address], [place], [place], [address] Phone [phone], Fax [phone]

## 2021-09-25 ENCOUNTER — Ambulatory Visit: Payer: Medicare HMO | Admitting: Cardiology

## 2021-09-25 ENCOUNTER — Other Ambulatory Visit: Payer: Self-pay | Admitting: Endocrinology

## 2021-09-25 ENCOUNTER — Encounter: Payer: Self-pay | Admitting: Cardiology

## 2021-09-25 VITALS — BP 122/76 | HR 93 | Ht 71.0 in | Wt 215.0 lb

## 2021-09-25 DIAGNOSIS — I10 Essential (primary) hypertension: Secondary | ICD-10-CM | POA: Diagnosis not present

## 2021-09-25 DIAGNOSIS — I5022 Chronic systolic (congestive) heart failure: Secondary | ICD-10-CM

## 2021-09-25 DIAGNOSIS — I252 Old myocardial infarction: Secondary | ICD-10-CM

## 2021-09-25 DIAGNOSIS — R011 Cardiac murmur, unspecified: Secondary | ICD-10-CM

## 2021-09-25 DIAGNOSIS — R0602 Shortness of breath: Secondary | ICD-10-CM | POA: Diagnosis not present

## 2021-09-25 NOTE — Patient Instructions (Signed)
Medication Instructions:  Continue same medications   Lab Work: None ordered   Testing/Procedures: Echo   Follow-Up: At [facility], you and your health needs are our priority.  As part of our continuing mission to provide you with exceptional heart care, we have created designated Provider Care Teams.  These Care Teams include your primary Cardiologist (physician) and Advanced Practice Providers (APPs -  Physician Assistants and Nurse Practitioners) who all work together to provide you with the care you need, when you need it.  We recommend signing up for the patient portal called "MyChart".  Sign up information is provided on this After Visit Summary.  MyChart is used to connect with patients for Virtual Visits (Telemedicine).  Patients are able to view lab/test results, encounter notes, upcoming appointments, etc.  Non-urgent messages can be sent to your provider as well.   To learn more about what you can do with MyChart, go to [email or web address].    Your next appointment: After echo      The format for your next appointment: Office     Provider:  [place]

## 2021-10-09 ENCOUNTER — Other Ambulatory Visit: Payer: Self-pay | Admitting: Endocrinology

## 2021-10-09 DIAGNOSIS — E119 Type 2 diabetes mellitus without complications: Secondary | ICD-10-CM

## 2021-10-16 ENCOUNTER — Ambulatory Visit (INDEPENDENT_AMBULATORY_CARE_PROVIDER_SITE_OTHER): Payer: Medicare HMO | Admitting: Cardiology

## 2021-10-16 ENCOUNTER — Ambulatory Visit (HOSPITAL_COMMUNITY): Payer: Medicare HMO | Attending: Cardiovascular Disease

## 2021-10-16 ENCOUNTER — Encounter: Payer: Self-pay | Admitting: Cardiology

## 2021-10-16 VITALS — BP 122/72 | HR 88 | Ht 71.0 in | Wt 220.0 lb

## 2021-10-16 DIAGNOSIS — I5023 Acute on chronic systolic (congestive) heart failure: Secondary | ICD-10-CM

## 2021-10-16 DIAGNOSIS — I340 Nonrheumatic mitral (valve) insufficiency: Secondary | ICD-10-CM | POA: Diagnosis not present

## 2021-10-16 DIAGNOSIS — I10 Essential (primary) hypertension: Secondary | ICD-10-CM | POA: Diagnosis not present

## 2021-10-16 DIAGNOSIS — I252 Old myocardial infarction: Secondary | ICD-10-CM

## 2021-10-16 DIAGNOSIS — I5022 Chronic systolic (congestive) heart failure: Secondary | ICD-10-CM | POA: Diagnosis not present

## 2021-10-16 DIAGNOSIS — R011 Cardiac murmur, unspecified: Secondary | ICD-10-CM | POA: Insufficient documentation

## 2021-10-16 DIAGNOSIS — Z01812 Encounter for preprocedural laboratory examination: Secondary | ICD-10-CM | POA: Diagnosis not present

## 2021-10-16 DIAGNOSIS — I25118 Atherosclerotic heart disease of native coronary artery with other forms of angina pectoris: Secondary | ICD-10-CM | POA: Diagnosis not present

## 2021-10-16 DIAGNOSIS — Z79899 Other long term (current) drug therapy: Secondary | ICD-10-CM | POA: Diagnosis not present

## 2021-10-16 DIAGNOSIS — R0602 Shortness of breath: Secondary | ICD-10-CM

## 2021-10-16 LAB — ECHOCARDIOGRAM COMPLETE
Area-P 1/2: 5.4 cm2
MV M vel: 3.97 m/s
MV Peak grad: 63 mmHg
S' Lateral: 5.6 cm

## 2021-10-16 MED ORDER — CARVEDILOL 6.25 MG PO TABS: 6.2500 mg | ORAL_TABLET | Freq: Two times a day (BID) | ORAL | 3 refills | Status: AC

## 2021-10-16 MED ORDER — SPIRONOLACTONE 25 MG PO TABS: 12.5000 mg | ORAL_TABLET | Freq: Every day | ORAL | 2 refills | Status: DC

## 2021-10-16 MED ORDER — ENTRESTO 24-26 MG PO TABS: 1.0000 | ORAL_TABLET | Freq: Two times a day (BID) | ORAL | 2 refills | Status: DC

## 2021-10-16 MED ORDER — POTASSIUM CHLORIDE CRYS ER 10 MEQ PO TBCR: EXTENDED_RELEASE_TABLET | ORAL | 0 refills | Status: DC

## 2021-10-16 MED ORDER — FUROSEMIDE 40 MG PO TABS: ORAL_TABLET | ORAL | 0 refills | Status: DC

## 2021-10-16 NOTE — Progress Notes (Signed)
Cardiology Office Note:    Date:  10/16/2021   ID:  Eric Grant, DOB 1940/11/17, MRN [id number]  PCP:  [name], MD   [facility] Providers Cardiologist:  None {   Referring MD: [name], MD    History of Present Illness:    Eric Grant is a 81 y.o. male with a hx of systolic heart failure with LVEF 35-40% in 2011, CAD with inferolateral scar on myoview in 2007 (declined cath), HTN, HLD, and CKD who presented to clinic today for echo found to have LVEF <20% and severe MR now added on as an urgent visit.   Per review of the record, he had a Myoview study in 2007 showing inferolateral scar and EF 34%. He had an Echo in 2011 showing inferior and inferolateral scar with EF 35-40%. Patient deferred cardiac cath in the past. Recently established care with Dr. Martinique where he was noted to have a MR murmur on exam prompting TTE.   Today, patient presented for planned TTE where he was found to have LVEF<20% with severe global hypokinesis and akinesis of the inferolateral wall, severe RV dysfunction, severe MR, RAP 47mHg. Currently, he states he is feeling "so-so."  He does develop shortness of breath with walking and activity, however, he is able to take breaks and resume activity. Last week, he was able to push mow 2 lawns taking just a couple of breaks to catch his breath and drink water. No chest pain, lightheadedness, dizziness of syncope.   Has developed LE edema that he states has been ongoing for the past 2 weeks. Sometimes he experiences orthopnea and PND. He sleeps propped up on 2 pillows, but that is not new.  He denies any palpitations, lightheadedness, headaches, or syncope.  He remains compliant with his medications.   Past Medical History:  Diagnosis Date   Seizures ([place] 1991   Urinary tract infection 07/2012    No past surgical history on file.  Current Medications: Current Meds  Medication Sig   Accu-Chek FastClix Lancets MISC USE  TO CHECK BLOOD  SUGAR FOUR TIMES DAILY   albuterol (VENTOLIN HFA) 108 (90 Base) MCG/ACT inhaler 2 puff as needed   Alcohol Swabs 70 % PADS Use as directed   allopurinol (ZYLOPRIM) 100 MG tablet TAKE 1 TABLET EVERY DAY   aspirin 81 MG tablet Take 81 mg by mouth daily.   Blood Glucose Calibration (ACCU-CHEK AVIVA) SOLN Use as instructed.   Blood Glucose Calibration (TRUE METRIX LEVEL 1) Low SOLN USE TO CALIBRATE GLUCOMETER AS DIRECTED   Blood Glucose Monitoring Suppl (ACCU-CHEK GUIDE) w/Device KIT USE AS DIRECTED   carvedilol (COREG) 6.25 MG tablet Take 1 tablet (6.25 mg total) by mouth 2 (two) times daily.   furosemide (LASIX) 40 MG tablet Take 1 tablet (40 mg total) by mouth twice daily for 3 days only, then take 1 tablet (40 mg total) by mouth daily thereafter.   Insulin Pen Needle (DROPLET PEN NEEDLES) 31G X 8 MM MISC USE 3 DAILY   metFORMIN (GLUCOPHAGE) 850 MG tablet TAKE 1 TABLET TWICE DAILY WITH MEALS   NOVOLOG FLEXPEN 100 UNIT/ML FlexPen INJECT 4 UNITS SUBCUTANEOUSLY AT BREAKFAST AND 14 TO 15 UNITS  AT SUPPER   pioglitazone (ACTOS) 15 MG tablet Take 1 tablet by mouth once daily   potassium chloride (KLOR-CON M) 10 MEQ tablet Take 1 tablet (10 mEq total) by mouth twice daily for 3 days only, then decrease to taking 1 tablet (10 mEq total) by mouth daily thereafter.  rosuvastatin (CRESTOR) 20 MG tablet TAKE 1 TABLET EVERY DAY   sacubitril-valsartan (ENTRESTO) 24-26 MG Take 1 tablet by mouth 2 (two) times daily.   spironolactone (ALDACTONE) 25 MG tablet Take 0.5 tablets (12.5 mg total) by mouth daily.   TRESIBA FLEXTOUCH 100 UNIT/ML FlexTouch Pen INJECT 14 UNITS UNDER THE SKIN 1 TIME DAILY   TRUE METRIX BLOOD GLUCOSE TEST test strip TEST FOUR TIMES DAILY   TRUEplus Lancets 28G MISC TEST BLOOD SUGAR FOUR TIMES DAILY   [DISCONTINUED] carvedilol (COREG) 12.5 MG tablet TAKE 1 TABLET TWICE DAILY WITH A MEAL   [DISCONTINUED] olmesartan (BENICAR) 20 MG tablet TAKE 1 TABLET EVERY DAY     Allergies:   Patient  has no known allergies.   Social History   Socioeconomic History   Marital status: Single    Spouse name: Not on file   Number of children: Not on file   Years of education: Not on file   Highest education level: Not on file  Occupational History   Not on file  Tobacco Use   Smoking status: Never   Smokeless tobacco: Never  Substance and Sexual Activity   Alcohol use: Not on file   Drug use: Not on file   Sexual activity: Not on file  Other Topics Concern   Not on file  Social History Narrative   Not on file   Social Determinants of Health   Financial Resource Strain: Not on file  Food Insecurity: Not on file  Transportation Needs: Not on file  Physical Activity: Not on file  Stress: Not on file  Social Connections: Not on file     Family History: The patient's family history includes Diabetes in his father and mother; Hyperlipidemia in his mother; Hypertension in his father and mother. There is no history of Cancer.  ROS:   Review of Systems  Constitutional:  Negative for chills and fever.  HENT:  Negative for nosebleeds and tinnitus.   Eyes:  Negative for double vision.  Respiratory:  Positive for shortness of breath. Negative for cough and hemoptysis.   Cardiovascular:  Positive for orthopnea, leg swelling and PND. Negative for chest pain, palpitations and claudication.  Gastrointestinal:  Negative for abdominal pain and blood in stool.  Genitourinary:  Negative for hematuria.  Musculoskeletal:  Negative for falls.  Neurological:  Negative for weakness and headaches.  Endo/Heme/Allergies:  Negative for polydipsia.  Psychiatric/Behavioral:  Negative for depression and hallucinations.      EKGs/Labs/Other Studies Reviewed:    The following studies were reviewed today:  TTE 10/16/2021: LVEF <20% with global hypokinesis and akinesis of the inferolateral wall, severe RV dysfunction, severe MR, RAP 21mHg. Full report pending.  EKG:  EKG is personally  reviewed. 10/16/2021: EKG was not ordered.   Recent Labs: 07/10/2021: BUN 16; Creatinine, Ser 1.38; Potassium 4.3; Sodium 140   Recent Lipid Panel    Component Value Date/Time   CHOL 97 08/17/2020 1347   TRIG 74.0 08/17/2020 1347   HDL 37.90 (L) 08/17/2020 1347   CHOLHDL 3 08/17/2020 1347   VLDL 14.8 08/17/2020 1347   LDLCALC 44 08/17/2020 1347     Risk Assessment/Calculations:           Physical Exam:    VS:  BP 122/72   Pulse 88   Ht _0  (1.803 m)   Wt 220 lb (99.8 kg)   SpO2 95%   BMI 30.68 kg/m     Wt Readings from Last 3 Encounters:  10/16/21 220 lb (  99.8 kg)  09/25/21 215 lb (97.5 kg)  07/13/21 218 lb 12.8 oz (99.2 kg)     GEN: Well nourished, well developed in no acute distress HEENT: Normal NECK: JVD to angle of the mandible CARDIAC: RRR, 3/6 systolic murmur best heard at apex, No rubs, No gallops RESPIRATORY:  Clear to auscultation without rales, wheezing or rhonchi  ABDOMEN: Soft, non-tender, non-distended MUSCULOSKELETAL:  2+ LE edema; No deformity. Warm on exam SKIN: Warm and dry NEUROLOGIC:  Alert and oriented x 3 PSYCHIATRIC:  Normal affect   ASSESSMENT:    1. Pre-procedure lab exam   2. Acute on chronic systolic heart failure ([place])   3. Medication management   4. Coronary artery disease of native artery of native heart with stable angina pectoris ([place])   5. Primary hypertension   6. Severe mitral regurgitation    PLAN:    In order of problems listed above:  #Acute on Chronic Systolic Heart Failure: EF on TTE today <20% with global hypokinesis, severe MR, RV severely down based on prelim review. Currently with NYHA class II symptoms. Appears overloaded on exam with 2+ pitting edema and elevated JVD. Will optimize medications as able and plan for RHC/LHC once more euvolemic.  -Start lasix 40mg  BID x3 days and then daily thereafter -Decrease coreg to 6.25mg  BID given severe BiV dysfunction and to allow for BP room for entresto -Change  olmesartan to entresto 24/26mg  BID -Start spironlactone 12.5mg  daily -Will start farxiga post-cath -BMET this Friday -Plan for RHC/LHC next week  #CAD: Myoview in 2007 with inferolateral infarct. Now with severely depressed LVEF on TTE as above. Denies any chest pain, however, has dyspnea on exertion and likely significant CAD given inferolateral infarct on myoview and TTE. Will plan for cath as above. -Plan for LHC/RHC next week -Continue ASA 81mg  daily -Continue crestor 20mg  daily -Decrease coreg to 6.25mg  BID given severe BiV dysfunction and to allow for BP room for entresto  #Severe MR: Noted on TTE today. Appears to be due to tethering of posterior leaflet. Will need diuresis and ischemic work-up as above. Once euvolemic and ischemic work-up completed, will plan for repeat TTE to reassess degree of MR. May need TEE in future  #HTN: -Decrease coreg to 6.25mg  BID given severe BiV dysfunction and to allow for BP room for entresto -Change olmesartan to entresto 24/26mg  BID -Start spironlactone 12.5mg  daily  #HLD: -Continue crestor 20mg  daily     Shared Decision Making/Informed Consent The risks [stroke (1 in 1000), death (1 in 1000), kidney failure [usually temporary] (1 in 500), bleeding (1 in 200), allergic reaction [possibly serious] (1 in 200)], benefits (diagnostic support and management of coronary artery disease) and alternatives of a cardiac catheterization were discussed in detail with Mr. Heckendorn and he is willing to proceed.   Follow-up:  With Dr. Martinique on 10/30/21.  Medication Adjustments/Labs and Tests Ordered: Current medicines are reviewed at length with the patient today.  Concerns regarding medicines are outlined above.   Orders Placed This Encounter  Procedures   Basic metabolic panel   CBC w/Diff   Meds ordered this encounter  Medications   carvedilol (COREG) 6.25 MG tablet    Sig: Take 1 tablet (6.25 mg total) by mouth 2 (two) times daily.    Dispense:   180 tablet    Refill:  3    Dose decrease   sacubitril-valsartan (ENTRESTO) 24-26 MG    Sig: Take 1 tablet by mouth 2 (two) times daily.    Dispense:  60 tablet    Refill:  2    Please Honor Card patient is presenting for Carmie Kanner: [id number]; Juanna Cao: [id number]; ATFTD: OHS; DUKG: [phone]   furosemide (LASIX) 40 MG tablet    Sig: Take 1 tablet (40 mg total) by mouth twice daily for 3 days only, then take 1 tablet (40 mg total) by mouth daily thereafter.    Dispense:  36 tablet    Refill:  0   potassium chloride (KLOR-CON M) 10 MEQ tablet    Sig: Take 1 tablet (10 mEq total) by mouth twice daily for 3 days only, then decrease to taking 1 tablet (10 mEq total) by mouth daily thereafter.    Dispense:  36 tablet    Refill:  0   spironolactone (ALDACTONE) 25 MG tablet    Sig: Take 0.5 tablets (12.5 mg total) by mouth daily.    Dispense:  45 tablet    Refill:  2   Patient Instructions  Medication Instructions:   STOP TAKING OLMESARTAN NOW  START TAKING ENTRESTO 24/26 MG DOSE-TAKE 1 TABLET BY MOUTH TWICE DAILY  START TAKING LASIX 40 MG BY MOUTH TWICE DAILY FOR 3 DAYS ONLY, THEN DECREASE TO TAKING 40 MG BY MOUTH DAILY THEREAFTER.   START TAKING POTASSIUM CHLORIDE 10 mEq BY MOUTH TWICE DAILY FOR 3 DAYS ONLY, THEN DECREASE TO TAKING 10 mEq BY MOUTH DAILY THEREAFTER.  START TAKING SPIRONOLACTONE 12.5 MG BY MOUTH DAILY  DECREASE YOUR CARVEDILOL (COREG) TO 6.25 MG BY MOUTH TWICE DAILY   *If you need a refill on your cardiac medications before your next appointment, please call your pharmacy*   Lab Work:  1.) THIS FRIDAY 10/20/21 HERE AT OUR OFFICE --CHECK BMET AND CBC W DIFF  If you have labs (blood work) drawn today and your tests are completely normal, you will receive your results only by: [place] (if you have MyChart) OR A paper copy in the mail If you have any lab test that is abnormal or we need to change your treatment, we will call you to review the  results.   Testing/Procedures:   [place] OFFICE [place], [place] [address] Dept: [phone] Loc: [place]  10/16/2021  You are scheduled for a Cardiac Catheterization on Monday, June 19 with Dr. Larae Grooms.  1. Please arrive at the Main Entrance A at [facility]: [place], [place] [address] at 7:00 AM (This time is two hours before your procedure to ensure your preparation). Free valet parking service is available.   Special note: Every effort is made to have your procedure done on time. Please understand that emergencies sometimes delay scheduled procedures.  2. Diet: Do not eat solid foods after midnight.  You may have clear liquids until 5 AM upon the day of the procedure.  3. Labs: You will need to have blood drawn on Friday, June 16 at [facility] at [facility]. [address]  Open: 7:30am - 5pm    Phone: [phone]. You do not need to be fasting.  4. Medication instructions in preparation for your procedure:   Contrast Allergy: No   HOLD FUROSEMIDE (LASIX) AND POTASSIUM CHLORIDE THE MORNING OF YOUR CATH (HOLD ON JUNE 19)    HOLD SPIRONOLACTONE THE MORNING OF YOUR CATH (HOLD ON JUNE 19)  Take only 7 units of insulin the night before your procedure. Do not  take any insulin on the day of the procedure.  Do not take Diabetes Med Glucophage (Metformin) on the day of the procedure and HOLD 48 HOURS AFTER THE PROCEDURE.  On the morning of your procedure, take Aspirin and any morning medicines NOT listed above.  You may use sips of water.  5. Plan to go home the same day, you will only stay overnight if medically necessary. 6. You MUST have a responsible adult to drive you home. 7. An adult MUST be with you the first 24 hours after you arrive home. 8. Bring a current list of your medications,  and the last time and date medication taken. 9. Bring ID and current insurance cards. 10.Please wear clothes that are easy to get on and off and wear slip-on shoes.  Thank you for allowing [place] to care for you!   -- [place] [facility]    Follow-Up:  WITH DR. Martinique AT [facility] OFFICE AS SCHEDULED FOR 10/30/21            I,Mathew Stumpf,acting as a scribe for Freada Bergeron, MD.,have documented all relevant documentation on the behalf of Freada Bergeron, MD,as directed by  Freada Bergeron, MD while in the presence of Freada Bergeron, MD.  I, Freada Bergeron, MD, have reviewed all documentation for this visit. The documentation on 10/16/21 for the exam, diagnosis, procedures, and orders are all accurate and complete.   Signed, Freada Bergeron, MD  10/16/2021 1:48 PM    [place]

## 2021-10-16 NOTE — Patient Instructions (Signed)
Medication Instructions:   STOP TAKING OLMESARTAN NOW  START TAKING ENTRESTO 24/26 MG DOSE-TAKE 1 TABLET BY MOUTH TWICE DAILY  START TAKING LASIX 40 MG BY MOUTH TWICE DAILY FOR 3 DAYS ONLY, THEN DECREASE TO TAKING 40 MG BY MOUTH DAILY THEREAFTER.   START TAKING POTASSIUM CHLORIDE 10 mEq BY MOUTH TWICE DAILY FOR 3 DAYS ONLY, THEN DECREASE TO TAKING 10 mEq BY MOUTH DAILY THEREAFTER.  START TAKING SPIRONOLACTONE 12.5 MG BY MOUTH DAILY  DECREASE YOUR CARVEDILOL (COREG) TO 6.25 MG BY MOUTH TWICE DAILY   *If you need a refill on your cardiac medications before your next appointment, please call your pharmacy*   Lab Work:  1.) THIS FRIDAY 10/20/21 HERE AT OUR OFFICE --CHECK BMET AND CBC W DIFF  If you have labs (blood work) drawn today and your tests are completely normal, you will receive your results only by: [place] (if you have MyChart) OR A paper copy in the mail If you have any lab test that is abnormal or we need to change your treatment, we will call you to review the results.   Testing/Procedures:   [place] OFFICE [place], [place] [address] Dept: [phone] Loc: [place]  10/16/2021  You are scheduled for a Cardiac Catheterization on Monday, June 19 with Dr. Larae Grooms.  1. Please arrive at the Main Entrance A at [facility]: [place], [place] [address] at 7:00 AM (This time is two hours before your procedure to ensure your preparation). Free valet parking service is available.   Special note: Every effort is made to have your procedure done on time. Please understand that emergencies sometimes delay scheduled procedures.  2. Diet: Do not eat solid foods after midnight.  You may have clear liquids until 5 AM upon the day of the procedure.  3. Labs: You will need to have blood drawn on Friday, June 16 at [facility] at [facility]. [address]  Open: 7:30am - 5pm    Phone: [phone]. You do not need to be fasting.  4. Medication instructions in preparation for your procedure:   Contrast Allergy: No   HOLD FUROSEMIDE (LASIX) AND POTASSIUM CHLORIDE THE MORNING OF YOUR CATH (HOLD ON JUNE 19)    HOLD SPIRONOLACTONE THE MORNING OF YOUR CATH (HOLD ON JUNE 19)  Take only 7 units of insulin the night before your procedure. Do not take any insulin on the day of the procedure.  Do not take Diabetes Med Glucophage (Metformin) on the day of the procedure and HOLD 48 HOURS AFTER THE PROCEDURE.  On the morning of your procedure, take Aspirin and any morning medicines NOT listed above.  You may use sips of water.  5. Plan to go home the same day, you will only stay overnight if medically necessary. 6. You MUST have a responsible adult to drive you home. 7. An adult MUST be with you the first 24 hours after you arrive home. 8. Bring a current list of your medications, and the last time and date medication taken. 9. Bring ID and current insurance cards. 10.Please wear clothes that are easy to get on and off and wear slip-on shoes.  Thank you for allowing [place] to care for you!   -- [place] [facility]    Follow-Up:  WITH DR. Martinique AT [facility] OFFICE AS SCHEDULED FOR 10/30/21

## 2021-10-16 NOTE — Progress Notes (Deleted)
Cardiology Office Note:    Date:  10/16/2021   ID:  Eric Grant, DOB 1940-05-17, MRN [id number]  PCP:  Georgann Housekeeper, MD   [facility] Providers Cardiologist:  None {   Referring MD: Georgann Housekeeper, MD    History of Present Illness:    Eric Grant is a 81 y.o. male with a hx of systolic heart failure with LVEF 35-40% in 2011, CAD with inferolateral scar on myoview in 2007 (declined cath), HTN, HLD, and CKD who presented to clinic today for echo found to have LVEF <20% and severe MR now added on as an urgent visit.   Per review of the record, he had a Myoview study in 2007 showing inferolateral scar and EF 34%. He had an Echo in 2011 showing inferior and inferolateral scar with EF 35-40%. Patient deferred cardiac cath in the past. Recently established care with Dr. [place] where he was noted to have a MR murmur on exam prompting TTE.   Today, patient presented for planned TTE where he was found to have LVEF<20% with severe global hypokinesis and akinesis of the inferolateral wall, severe RV dysfunction, severe MR, RAP . Currently, ***   Past Medical History:  Diagnosis Date   Seizures ([facility]) 1991   Urinary tract infection 07/2012    No past surgical history on file.  Current Medications: No outpatient medications have been marked as taking for the 10/16/21 encounter (Appointment) with Meriam Sprague, MD.     Allergies:   Patient has no known allergies.   Social History   Socioeconomic History   Marital status: Single    Spouse name: Not on file   Number of children: Not on file   Years of education: Not on file   Highest education level: Not on file  Occupational History   Not on file  Tobacco Use   Smoking status: Never   Smokeless tobacco: Never  Substance and Sexual Activity   Alcohol use: Not on file   Drug use: Not on file   Sexual activity: Not on file  Other Topics Concern   Not on file  Social History Narrative   Not on file   Social  Determinants of Health   Financial Resource Strain: Not on file  Food Insecurity: Not on file  Transportation Needs: Not on file  Physical Activity: Not on file  Stress: Not on file  Social Connections: Not on file     Family History: The patient's ***family history includes Diabetes in his father and mother; Hyperlipidemia in his mother; Hypertension in his father and mother. There is no history of Cancer.  ROS:   Please see the history of present illness.    *** All other systems reviewed and are negative.  EKGs/Labs/Other Studies Reviewed:    The following studies were reviewed today: ***  EKG:  EKG is *** ordered today.  The ekg ordered today demonstrates ***  Recent Labs: 07/10/2021: BUN 16; Creatinine, Ser 1.38; Potassium 4.3; Sodium 140  Recent Lipid Panel    Component Value Date/Time   CHOL 97 08/17/2020 1347   TRIG 74.0 08/17/2020 1347   HDL 37.90 (L) 08/17/2020 1347   CHOLHDL 3 08/17/2020 1347   VLDL 14.8 08/17/2020 1347   LDLCALC 44 08/17/2020 1347     Risk Assessment/Calculations:   {Does this patient have ATRIAL FIBRILLATION?:[phone]}       Physical Exam:    VS:  There were no vitals taken for this visit.    Wt Readings from  Last 3 Encounters:  09/25/21 215 lb (97.5 kg)  07/13/21 218 lb 12.8 oz (99.2 kg)  12/19/20 213 lb 3.2 oz (96.7 kg)     GEN: *** Well nourished, well developed in no acute distress HEENT: Normal NECK: No JVD; No carotid bruits LYMPHATICS: No lymphadenopathy CARDIAC: ***RRR, no murmurs, rubs, gallops RESPIRATORY:  Clear to auscultation without rales, wheezing or rhonchi  ABDOMEN: Soft, non-tender, non-distended MUSCULOSKELETAL:  No edema; No deformity  SKIN: Warm and dry NEUROLOGIC:  Alert and oriented x 3 PSYCHIATRIC:  Normal affect   ASSESSMENT:    No diagnosis found. PLAN:    In order of problems listed above:  #Acute on Chronic Systolic Heart Failure: EF on TTE today <20% with global hypokinesis, severe MR,  RV moderately down based on prelim review. Currently with NYHA class II symptoms. Appears overloaded on exam with 1+ pitting edema.  -Start lasix 40mg  daily -Continue coreg 12.5mg  BID -Change olmesartan to entresto 24/26mg  BID -Start spironlactone 12.5mg  daily -Start farxiga 10mg  daily -Once euvolemic, will need RHC/LHC for further evaluation  #CAD: Myoview in 2007 with inferolateral infarct. Now with severely depressed LVEF. Denies any chest pain, however, given significant drop in EF, will need cath. -Plan for cath once more euvolemic -Continue ASA 81mg  daily -Continue crestor 20mg  daily  #Severe MR: Noted on TTE today. Will need diuresis and ischemic work-up as above. Once euvolemic and ischemic work-up completed, will plan for repeat TTE to reassess degree of MR. May need TEE in future  #HTN: -Continue coreg 12.5mg  BID -Change olmesartan to entresto 24/26mg  BID -Start spironlactone 12.5mg  daily     {Are you ordering a CV Procedure (e.g. stress test, cath, DCCV, TEE, etc)?   Press F2        :    Medication Adjustments/Labs and Tests Ordered: Current medicines are reviewed at length with the patient today.  Concerns regarding medicines are outlined above.  No orders of the defined types were placed in this encounter.  No orders of the defined types were placed in this encounter.   There are no Patient Instructions on file for this visit.   Signed, 2008, MD  10/16/2021 12:22 PM    [facility]

## 2021-10-16 NOTE — H&P (View-Only) (Signed)
Cardiology Office Note:    Date:  10/16/2021   ID:  Eric Grant, DOB 1940/11/17, MRN [id number]  PCP:  [name], MD   [facility] Providers Cardiologist:  None {   Referring MD: [name], MD    History of Present Illness:    Eric Grant is a 81 y.o. male with a hx of systolic heart failure with LVEF 35-40% in 2011, CAD with inferolateral scar on myoview in 2007 (declined cath), HTN, HLD, and CKD who presented to clinic today for echo found to have LVEF <20% and severe MR now added on as an urgent visit.   Per review of the record, he had a Myoview study in 2007 showing inferolateral scar and EF 34%. He had an Echo in 2011 showing inferior and inferolateral scar with EF 35-40%. Patient deferred cardiac cath in the past. Recently established care with Dr. Martinique where he was noted to have a MR murmur on exam prompting TTE.   Today, patient presented for planned TTE where he was found to have LVEF<20% with severe global hypokinesis and akinesis of the inferolateral wall, severe RV dysfunction, severe MR, RAP 47mHg. Currently, he states he is feeling "so-so."  He does develop shortness of breath with walking and activity, however, he is able to take breaks and resume activity. Last week, he was able to push mow 2 lawns taking just a couple of breaks to catch his breath and drink water. No chest pain, lightheadedness, dizziness of syncope.   Has developed LE edema that he states has been ongoing for the past 2 weeks. Sometimes he experiences orthopnea and PND. He sleeps propped up on 2 pillows, but that is not new.  He denies any palpitations, lightheadedness, headaches, or syncope.  He remains compliant with his medications.   Past Medical History:  Diagnosis Date   Seizures ([place] 1991   Urinary tract infection 07/2012    No past surgical history on file.  Current Medications: Current Meds  Medication Sig   Accu-Chek FastClix Lancets MISC USE  TO CHECK BLOOD  SUGAR FOUR TIMES DAILY   albuterol (VENTOLIN HFA) 108 (90 Base) MCG/ACT inhaler 2 puff as needed   Alcohol Swabs 70 % PADS Use as directed   allopurinol (ZYLOPRIM) 100 MG tablet TAKE 1 TABLET EVERY DAY   aspirin 81 MG tablet Take 81 mg by mouth daily.   Blood Glucose Calibration (ACCU-CHEK AVIVA) SOLN Use as instructed.   Blood Glucose Calibration (TRUE METRIX LEVEL 1) Low SOLN USE TO CALIBRATE GLUCOMETER AS DIRECTED   Blood Glucose Monitoring Suppl (ACCU-CHEK GUIDE) w/Device KIT USE AS DIRECTED   carvedilol (COREG) 6.25 MG tablet Take 1 tablet (6.25 mg total) by mouth 2 (two) times daily.   furosemide (LASIX) 40 MG tablet Take 1 tablet (40 mg total) by mouth twice daily for 3 days only, then take 1 tablet (40 mg total) by mouth daily thereafter.   Insulin Pen Needle (DROPLET PEN NEEDLES) 31G X 8 MM MISC USE 3 DAILY   metFORMIN (GLUCOPHAGE) 850 MG tablet TAKE 1 TABLET TWICE DAILY WITH MEALS   NOVOLOG FLEXPEN 100 UNIT/ML FlexPen INJECT 4 UNITS SUBCUTANEOUSLY AT BREAKFAST AND 14 TO 15 UNITS  AT SUPPER   pioglitazone (ACTOS) 15 MG tablet Take 1 tablet by mouth once daily   potassium chloride (KLOR-CON M) 10 MEQ tablet Take 1 tablet (10 mEq total) by mouth twice daily for 3 days only, then decrease to taking 1 tablet (10 mEq total) by mouth daily thereafter.  rosuvastatin (CRESTOR) 20 MG tablet TAKE 1 TABLET EVERY DAY   sacubitril-valsartan (ENTRESTO) 24-26 MG Take 1 tablet by mouth 2 (two) times daily.   spironolactone (ALDACTONE) 25 MG tablet Take 0.5 tablets (12.5 mg total) by mouth daily.   TRESIBA FLEXTOUCH 100 UNIT/ML FlexTouch Pen INJECT 14 UNITS UNDER THE SKIN 1 TIME DAILY   TRUE METRIX BLOOD GLUCOSE TEST test strip TEST FOUR TIMES DAILY   TRUEplus Lancets 28G MISC TEST BLOOD SUGAR FOUR TIMES DAILY   [DISCONTINUED] carvedilol (COREG) 12.5 MG tablet TAKE 1 TABLET TWICE DAILY WITH A MEAL   [DISCONTINUED] olmesartan (BENICAR) 20 MG tablet TAKE 1 TABLET EVERY DAY     Allergies:   Patient  has no known allergies.   Social History   Socioeconomic History   Marital status: Single    Spouse name: Not on file   Number of children: Not on file   Years of education: Not on file   Highest education level: Not on file  Occupational History   Not on file  Tobacco Use   Smoking status: Never   Smokeless tobacco: Never  Substance and Sexual Activity   Alcohol use: Not on file   Drug use: Not on file   Sexual activity: Not on file  Other Topics Concern   Not on file  Social History Narrative   Not on file   Social Determinants of Health   Financial Resource Strain: Not on file  Food Insecurity: Not on file  Transportation Needs: Not on file  Physical Activity: Not on file  Stress: Not on file  Social Connections: Not on file     Family History: The patient's family history includes Diabetes in his father and mother; Hyperlipidemia in his mother; Hypertension in his father and mother. There is no history of Cancer.  ROS:   Review of Systems  Constitutional:  Negative for chills and fever.  HENT:  Negative for nosebleeds and tinnitus.   Eyes:  Negative for double vision.  Respiratory:  Positive for shortness of breath. Negative for cough and hemoptysis.   Cardiovascular:  Positive for orthopnea, leg swelling and PND. Negative for chest pain, palpitations and claudication.  Gastrointestinal:  Negative for abdominal pain and blood in stool.  Genitourinary:  Negative for hematuria.  Musculoskeletal:  Negative for falls.  Neurological:  Negative for weakness and headaches.  Endo/Heme/Allergies:  Negative for polydipsia.  Psychiatric/Behavioral:  Negative for depression and hallucinations.      EKGs/Labs/Other Studies Reviewed:    The following studies were reviewed today:  TTE 10/16/2021: LVEF <20% with global hypokinesis and akinesis of the inferolateral wall, severe RV dysfunction, severe MR, RAP 21mHg. Full report pending.  EKG:  EKG is personally  reviewed. 10/16/2021: EKG was not ordered.   Recent Labs: 07/10/2021: BUN 16; Creatinine, Ser 1.38; Potassium 4.3; Sodium 140   Recent Lipid Panel    Component Value Date/Time   CHOL 97 08/17/2020 1347   TRIG 74.0 08/17/2020 1347   HDL 37.90 (L) 08/17/2020 1347   CHOLHDL 3 08/17/2020 1347   VLDL 14.8 08/17/2020 1347   LDLCALC 44 08/17/2020 1347     Risk Assessment/Calculations:           Physical Exam:    VS:  BP 122/72   Pulse 88   Ht _0  (1.803 m)   Wt 220 lb (99.8 kg)   SpO2 95%   BMI 30.68 kg/m     Wt Readings from Last 3 Encounters:  10/16/21 220 lb (  99.8 kg)  09/25/21 215 lb (97.5 kg)  07/13/21 218 lb 12.8 oz (99.2 kg)     GEN: Well nourished, well developed in no acute distress HEENT: Normal NECK: JVD to angle of the mandible CARDIAC: RRR, 3/6 systolic murmur best heard at apex, No rubs, No gallops RESPIRATORY:  Clear to auscultation without rales, wheezing or rhonchi  ABDOMEN: Soft, non-tender, non-distended MUSCULOSKELETAL:  2+ LE edema; No deformity. Warm on exam SKIN: Warm and dry NEUROLOGIC:  Alert and oriented x 3 PSYCHIATRIC:  Normal affect   ASSESSMENT:    1. Pre-procedure lab exam   2. Acute on chronic systolic heart failure ([place])   3. Medication management   4. Coronary artery disease of native artery of native heart with stable angina pectoris ([place])   5. Primary hypertension   6. Severe mitral regurgitation    PLAN:    In order of problems listed above:  #Acute on Chronic Systolic Heart Failure: EF on TTE today <20% with global hypokinesis, severe MR, RV severely down based on prelim review. Currently with NYHA class II symptoms. Appears overloaded on exam with 2+ pitting edema and elevated JVD. Will optimize medications as able and plan for RHC/LHC once more euvolemic.  -Start lasix 40mg  BID x3 days and then daily thereafter -Decrease coreg to 6.25mg  BID given severe BiV dysfunction and to allow for BP room for entresto -Change  olmesartan to entresto 24/26mg  BID -Start spironlactone 12.5mg  daily -Will start farxiga post-cath -BMET this Friday -Plan for RHC/LHC next week  #CAD: Myoview in 2007 with inferolateral infarct. Now with severely depressed LVEF on TTE as above. Denies any chest pain, however, has dyspnea on exertion and likely significant CAD given inferolateral infarct on myoview and TTE. Will plan for cath as above. -Plan for LHC/RHC next week -Continue ASA 81mg  daily -Continue crestor 20mg  daily -Decrease coreg to 6.25mg  BID given severe BiV dysfunction and to allow for BP room for entresto  #Severe MR: Noted on TTE today. Appears to be due to tethering of posterior leaflet. Will need diuresis and ischemic work-up as above. Once euvolemic and ischemic work-up completed, will plan for repeat TTE to reassess degree of MR. May need TEE in future  #HTN: -Decrease coreg to 6.25mg  BID given severe BiV dysfunction and to allow for BP room for entresto -Change olmesartan to entresto 24/26mg  BID -Start spironlactone 12.5mg  daily  #HLD: -Continue crestor 20mg  daily     Shared Decision Making/Informed Consent The risks [stroke (1 in 1000), death (1 in 1000), kidney failure [usually temporary] (1 in 500), bleeding (1 in 200), allergic reaction [possibly serious] (1 in 200)], benefits (diagnostic support and management of coronary artery disease) and alternatives of a cardiac catheterization were discussed in detail with Mr. Standish and he is willing to proceed.   Follow-up:  With Dr. Martinique on 10/30/21.  Medication Adjustments/Labs and Tests Ordered: Current medicines are reviewed at length with the patient today.  Concerns regarding medicines are outlined above.   Orders Placed This Encounter  Procedures   Basic metabolic panel   CBC w/Diff   Meds ordered this encounter  Medications   carvedilol (COREG) 6.25 MG tablet    Sig: Take 1 tablet (6.25 mg total) by mouth 2 (two) times daily.    Dispense:   180 tablet    Refill:  3    Dose decrease   sacubitril-valsartan (ENTRESTO) 24-26 MG    Sig: Take 1 tablet by mouth 2 (two) times daily.    Dispense:  60 tablet    Refill:  2    Please Honor Card patient is presenting for Carmie Kanner: [id number]; Juanna Cao: [phone]; BZJIR: OHS; CVEL: [phone]   furosemide (LASIX) 40 MG tablet    Sig: Take 1 tablet (40 mg total) by mouth twice daily for 3 days only, then take 1 tablet (40 mg total) by mouth daily thereafter.    Dispense:  36 tablet    Refill:  0   potassium chloride (KLOR-CON M) 10 MEQ tablet    Sig: Take 1 tablet (10 mEq total) by mouth twice daily for 3 days only, then decrease to taking 1 tablet (10 mEq total) by mouth daily thereafter.    Dispense:  36 tablet    Refill:  0   spironolactone (ALDACTONE) 25 MG tablet    Sig: Take 0.5 tablets (12.5 mg total) by mouth daily.    Dispense:  45 tablet    Refill:  2   Patient Instructions  Medication Instructions:   STOP TAKING OLMESARTAN NOW  START TAKING ENTRESTO 24/26 MG DOSE-TAKE 1 TABLET BY MOUTH TWICE DAILY  START TAKING LASIX 40 MG BY MOUTH TWICE DAILY FOR 3 DAYS ONLY, THEN DECREASE TO TAKING 40 MG BY MOUTH DAILY THEREAFTER.   START TAKING POTASSIUM CHLORIDE 10 mEq BY MOUTH TWICE DAILY FOR 3 DAYS ONLY, THEN DECREASE TO TAKING 10 mEq BY MOUTH DAILY THEREAFTER.  START TAKING SPIRONOLACTONE 12.5 MG BY MOUTH DAILY  DECREASE YOUR CARVEDILOL (COREG) TO 6.25 MG BY MOUTH TWICE DAILY   *If you need a refill on your cardiac medications before your next appointment, please call your pharmacy*   Lab Work:  1.) THIS FRIDAY 10/20/21 HERE AT OUR OFFICE --CHECK BMET AND CBC W DIFF  If you have labs (blood work) drawn today and your tests are completely normal, you will receive your results only by: [place] (if you have MyChart) OR A paper copy in the mail If you have any lab test that is abnormal or we need to change your treatment, we will call you to review the  results.   Testing/Procedures:   [facility] OFFICE [place], [place] [address] Dept: [phone] Loc: [place]  10/16/2021  You are scheduled for a Cardiac Catheterization on Monday, June 19 with Dr. Larae Grooms.  1. Please arrive at the Main Entrance A at [facility]: [place], [place] [address] at 7:00 AM (This time is two hours before your procedure to ensure your preparation). Free valet parking service is available.   Special note: Every effort is made to have your procedure done on time. Please understand that emergencies sometimes delay scheduled procedures.  2. Diet: Do not eat solid foods after midnight.  You may have clear liquids until 5 AM upon the day of the procedure.  3. Labs: You will need to have blood drawn on Friday, June 16 at [facility] at [facility]. [address]  Open: 7:30am - 5pm    Phone: [phone]. You do not need to be fasting.  4. Medication instructions in preparation for your procedure:   Contrast Allergy: No   HOLD FUROSEMIDE (LASIX) AND POTASSIUM CHLORIDE THE MORNING OF YOUR CATH (HOLD ON JUNE 19)    HOLD SPIRONOLACTONE THE MORNING OF YOUR CATH (HOLD ON JUNE 19)  Take only 7 units of insulin the night before your procedure. Do not  take any insulin on the day of the procedure.  Do not take Diabetes Med Glucophage (Metformin) on the day of the procedure and HOLD 48 HOURS AFTER THE PROCEDURE.  On the morning of your procedure, take Aspirin and any morning medicines NOT listed above.  You may use sips of water.  5. Plan to go home the same day, you will only stay overnight if medically necessary. 6. You MUST have a responsible adult to drive you home. 7. An adult MUST be with you the first 24 hours after you arrive home. 8. Bring a current list of your medications,  and the last time and date medication taken. 9. Bring ID and current insurance cards. 10.Please wear clothes that are easy to get on and off and wear slip-on shoes.  Thank you for allowing [place] to care for you!   -- [place] [facility]    Follow-Up:  WITH DR. Martinique AT [facility] OFFICE AS SCHEDULED FOR 10/30/21            I,Mathew Stumpf,acting as a scribe for Freada Bergeron, MD.,have documented all relevant documentation on the behalf of Freada Bergeron, MD,as directed by  Freada Bergeron, MD while in the presence of Freada Bergeron, MD.  I, Freada Bergeron, MD, have reviewed all documentation for this visit. The documentation on 10/16/21 for the exam, diagnosis, procedures, and orders are all accurate and complete.   Signed, Freada Bergeron, MD  10/16/2021 1:48 PM    [place]

## 2021-10-19 ENCOUNTER — Telehealth: Payer: Self-pay | Admitting: *Deleted

## 2021-10-19 NOTE — Telephone Encounter (Addendum)
Cardiac Catheterization scheduled at [facility] for: Monday October 23, 2021 9 AM Arrival time and place: [facility] Main Entrance A at: 7 AM   Nothing to eat after midnight prior to procedure, clear liquids until 5 AM day of procedure.  Medication instructions: -Hold:  Insulin-AM of procedure/1/2 usual Tresiba dose HS prior to procedure  Metformin-day of procedure and 48 hours post procedure   Lasix/KCl/Spironolactone-AM of procedure  Patient reports he is not currently taking Actos. -Except hold medications usual morning medications can be taken with sips of water including aspirin 81 mg.  Confirmed patient has responsible adult to drive home post procedure and be with patient first 24 hours after arriving home.  Patient reports no new symptoms concerning for COVID-19.  Reviewed procedure instructions with patient.

## 2021-10-20 ENCOUNTER — Other Ambulatory Visit: Payer: Self-pay | Admitting: *Deleted

## 2021-10-20 ENCOUNTER — Other Ambulatory Visit: Payer: Medicare HMO | Admitting: *Deleted

## 2021-10-20 DIAGNOSIS — Z01812 Encounter for preprocedural laboratory examination: Secondary | ICD-10-CM | POA: Diagnosis not present

## 2021-10-20 DIAGNOSIS — Z79899 Other long term (current) drug therapy: Secondary | ICD-10-CM | POA: Diagnosis not present

## 2021-10-20 DIAGNOSIS — I5023 Acute on chronic systolic (congestive) heart failure: Secondary | ICD-10-CM | POA: Diagnosis not present

## 2021-10-20 LAB — CBC WITH DIFFERENTIAL/PLATELET
Basophils Absolute: 0 10*3/uL (ref 0.0–0.2)
Basos: 0 %
EOS (ABSOLUTE): 0.1 10*3/uL (ref 0.0–0.4)
Eos: 2 %
Hematocrit: 40.9 % (ref 37.5–51.0)
Hemoglobin: 13 g/dL (ref 13.0–17.7)
Lymphocytes Absolute: 0.9 10*3/uL (ref 0.7–3.1)
Lymphs: 13 %
MCH: 26.1 pg — ABNORMAL LOW (ref 26.6–33.0)
MCHC: 31.8 g/dL (ref 31.5–35.7)
MCV: 82 fL (ref 79–97)
Monocytes Absolute: 0.7 10*3/uL (ref 0.1–0.9)
Monocytes: 10 %
Neutrophils Absolute: 5.2 10*3/uL (ref 1.4–7.0)
Neutrophils: 75 %
Platelets: 256 10*3/uL (ref 150–450)
RBC: 4.99 x10E6/uL (ref 4.14–5.80)
RDW: 15.9 % — ABNORMAL HIGH (ref 11.6–15.4)
WBC: 7 10*3/uL (ref 3.4–10.8)

## 2021-10-20 LAB — BASIC METABOLIC PANEL
BUN/Creatinine Ratio: 10 (ref 10–24)
BUN: 20 mg/dL (ref 8–27)
CO2: 26 mmol/L (ref 20–29)
Calcium: 9.7 mg/dL (ref 8.6–10.2)
Chloride: 104 mmol/L (ref 96–106)
Creatinine, Ser: 1.96 mg/dL — ABNORMAL HIGH (ref 0.76–1.27)
Glucose: 155 mg/dL — ABNORMAL HIGH (ref 70–99)
Potassium: 4.5 mmol/L (ref 3.5–5.2)
Sodium: 144 mmol/L (ref 134–144)
eGFR: 34 mL/min/{1.73_m2} — ABNORMAL LOW (ref >59)

## 2021-10-20 NOTE — Progress Notes (Unsigned)
Cardiology Office Note   Date:  10/20/2021   ID:  Eric Grant, DOB 1941-03-27, MRN [id number]  PCP:  [name], MD  Cardiologist:   [name], MD   No chief complaint on file.     History of Present Illness: Eric Grant is a 81 y.o. male who is seen for follow up CAD and CHF. He had a Myoview study in 2007 showing inferolateral scar and EF 34%. He had an Echo in 2011 showing inferior and inferolateral scar with EF 35-40%. Patient deferred cardiac cath in the past. Last seen by [name] in 2010. He has a history of DM type 2 on insulin, HTN, HLD and CKD.   He was evaluated recently with increased SOB in the past year. Denies any chest pain. No palpitations or edema. Breathing is worse when the pollen is bad. No dizziness or syncope. Stays active maintaining 3 yards. Retired from [employer].   We arranged for Echo. This showed severe LV and RV dysfunction with EF 15%. There was severe MR. He was seen that day by [name] for evaluation. Was started on Entresto and aldactone. Coreg dose reduced. Scheduled for [facility].     Past Medical History:  Diagnosis Date   Seizures ([place]) 1991   Urinary tract infection 07/2012    No past surgical history on file.   Current Outpatient Medications  Medication Sig Dispense Refill   Accu-Chek FastClix Lancets MISC USE  TO CHECK BLOOD SUGAR FOUR TIMES DAILY 400 each 2   albuterol (VENTOLIN HFA) 108 (90 Base) MCG/ACT inhaler Inhale 2 puffs into the lungs every 6 (six) hours as needed for wheezing or shortness of breath.     Alcohol Swabs 70 % PADS Use as directed 400 each 1   allopurinol (ZYLOPRIM) 100 MG tablet TAKE 1 TABLET EVERY DAY (Patient taking differently: Take 100 mg by mouth every evening.) 90 tablet 1   aspirin EC 81 MG tablet Take 81 mg by mouth in the morning.     Blood Glucose Calibration (ACCU-CHEK AVIVA) SOLN Use as instructed. 1 each 0   Blood Glucose Calibration (TRUE METRIX LEVEL 1) Low SOLN USE TO CALIBRATE  GLUCOMETER AS DIRECTED 1 each 0   Blood Glucose Monitoring Suppl (ACCU-CHEK GUIDE) w/Device KIT USE AS DIRECTED 1 kit 0   carvedilol (COREG) 6.25 MG tablet Take 1 tablet (6.25 mg total) by mouth 2 (two) times daily. 180 tablet 3   furosemide (LASIX) 40 MG tablet Take 1 tablet (40 mg total) by mouth twice daily for 3 days only, then take 1 tablet (40 mg total) by mouth daily thereafter. 36 tablet 0   Insulin Pen Needle (DROPLET PEN NEEDLES) 31G X 8 MM MISC USE 3 DAILY 300 each 2   metFORMIN (GLUCOPHAGE) 850 MG tablet TAKE 1 TABLET TWICE DAILY WITH MEALS 180 tablet 2   NOVOLOG FLEXPEN 100 UNIT/ML FlexPen INJECT 4 UNITS SUBCUTANEOUSLY AT BREAKFAST AND 14 TO 15 UNITS  AT SUPPER 30 mL 2   pioglitazone (ACTOS) 15 MG tablet Take 1 tablet by mouth once daily (Patient not taking: Reported on 10/18/2021) 90 tablet 0   potassium chloride (KLOR-CON M) 10 MEQ tablet Take 1 tablet (10 mEq total) by mouth twice daily for 3 days only, then decrease to taking 1 tablet (10 mEq total) by mouth daily thereafter. 36 tablet 0   rosuvastatin (CRESTOR) 20 MG tablet TAKE 1 TABLET EVERY DAY 90 tablet 1   sacubitril-valsartan (ENTRESTO) 24-26 MG Take 1 tablet by  mouth 2 (two) times daily. 60 tablet 2   spironolactone (ALDACTONE) 25 MG tablet Take 0.5 tablets (12.5 mg total) by mouth daily. 45 tablet 2   TRESIBA FLEXTOUCH 100 UNIT/ML FlexTouch Pen INJECT 14 UNITS UNDER THE SKIN 1 TIME DAILY (Patient taking differently: 14 Units at bedtime.) 15 mL 1   TRUE METRIX BLOOD GLUCOSE TEST test strip TEST FOUR TIMES DAILY 400 strip 1   TRUEplus Lancets 28G MISC TEST BLOOD SUGAR FOUR TIMES DAILY 200 each 3   No current facility-administered medications for this visit.    Allergies:   Patient has no known allergies.    Social History:  The patient  reports that he has never smoked. He has never used smokeless tobacco.   Family History:  The patient's family history includes Diabetes in his father and mother; Hyperlipidemia in his  mother; Hypertension in his father and mother.    ROS:  Please see the history of present illness.   Otherwise, review of systems are positive for none.   All other systems are reviewed and negative.    PHYSICAL EXAM: VS:  There were no vitals taken for this visit. , BMI There is no height or weight on file to calculate BMI. GEN: Well nourished, well developed, in no acute distress HEENT: normal Neck: no JVD, carotid bruits, or masses Cardiac: RRR; gr 2/6 holosystolic murmur LSB and apex. Trace edema. Respiratory:  clear to auscultation bilaterally, normal work of breathing GI: soft, nontender, nondistended, + BS MS: no deformity or atrophy Skin: warm and dry, no rash Neuro:  Strength and sensation are intact Psych: euthymic mood, full affect   EKG:  EKG is ordered today. The ekg ordered today demonstrates NSR with occ PVC. LVH with QRS widening. Diffuse T wave inversion- increased from 2015. I have personally reviewed and interpreted this study.    Recent Labs: 07/10/2021: BUN 16; Creatinine, Ser 1.38; Potassium 4.3; Sodium 140 A1c 6.1%    Lipid Panel    Component Value Date/Time   CHOL 97 08/17/2020 1347   TRIG 74.0 08/17/2020 1347   HDL 37.90 (L) 08/17/2020 1347   CHOLHDL 3 08/17/2020 1347   VLDL 14.8 08/17/2020 1347   LDLCALC 44 08/17/2020 1347      Wt Readings from Last 3 Encounters:  10/16/21 220 lb (99.8 kg)  09/25/21 215 lb (97.5 kg)  07/13/21 218 lb 12.8 oz (99.2 kg)      Other studies Reviewed: Additional studies/ records that were reviewed today include: s  Echo 10/16/21: IMPRESSIONS     1. Left ventricular ejection fraction, by estimation, is 10-15%. The left  ventricle has severely decreased function. The left ventricle demonstrates  global hypokinesis. The left ventricular internal cavity size was mildly  dilated. Left ventricular  diastolic parameters are consistent with Grade II diastolic dysfunction  (pseudonormalization).   2. Right  ventricular systolic function is severely reduced. The right  ventricular size is moderately enlarged. There is mildly elevated  pulmonary artery systolic pressure. The estimated right ventricular  systolic pressure is 65.0 mmHg.   3. Left atrial size was moderately dilated.   4. Severe MR secondary to restricted PMVL in systole (IIIB). This is  secondary to cardiomyopathy. The mitral valve is abnormal. Severe mitral  valve regurgitation.   5. The aortic valve is tricuspid. Aortic valve regurgitation is not  visualized. No aortic stenosis is present.   6. The inferior vena cava is normal in size with <50% respiratory  variability, suggesting right atrial pressure  of 8 mmHg.     ASSESSMENT AND PLAN:  1.  CAD. 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 No significant chest pain but does note some dyspnea. On Coreg, ASA, statin. Will update Echo. If EF down from prior may need cardiac cath.  2. Chronic systolic CHF. Prior EF 35%. Some increased dyspnea over the past year. Need to update Echo to reassess LV function. Currently on Coreg and Benicar. Consideration would be to switch ARB to Entresto, add aldactone or SGLT 2 inhibitor. If EF is poor may need to come off Actos. Will follow up post Echo 3. Murmur c/w MR on exam. Will check with Echo 4. HTN well controlled. 5. HLD excellent control on statin 6. DM type 2 on insulin. Consider adding SGLT 2 inhibitor. 7. CKD stage 3a  Current medicines are reviewed at length with the patient today.  The patient does not have concerns regarding medicines.  The following changes have been made:  no change  Labs/ tests ordered today include:   No orders of the defined types were placed in this encounter.        Disposition:   FU with me after Echo  Signed, Serah Nicoletti Martinique, MD  10/20/2021 7:42 AM    [facility] [address], [place], [place], [address] Phone  [phone], Fax [phone]

## 2021-10-23 ENCOUNTER — Other Ambulatory Visit: Payer: Self-pay

## 2021-10-23 ENCOUNTER — Ambulatory Visit (HOSPITAL_COMMUNITY)
Admission: RE | Admit: 2021-10-23 | Discharge: 2021-10-23 | Disposition: A | Discharge status: Home / Self Care | Payer: Medicare HMO | Attending: Interventional Cardiology | Admitting: Interventional Cardiology

## 2021-10-23 ENCOUNTER — Telehealth: Payer: Self-pay | Admitting: *Deleted

## 2021-10-23 ENCOUNTER — Encounter (HOSPITAL_COMMUNITY): Payer: Self-pay | Admitting: Interventional Cardiology

## 2021-10-23 ENCOUNTER — Encounter (HOSPITAL_COMMUNITY)
Admission: RE | Disposition: A | Discharge status: Home / Self Care | Payer: Self-pay | Source: Home / Self Care | Attending: Interventional Cardiology

## 2021-10-23 DIAGNOSIS — Z79899 Other long term (current) drug therapy: Secondary | ICD-10-CM | POA: Diagnosis not present

## 2021-10-23 DIAGNOSIS — E119 Type 2 diabetes mellitus without complications: Secondary | ICD-10-CM

## 2021-10-23 DIAGNOSIS — I2510 Atherosclerotic heart disease of native coronary artery without angina pectoris: Secondary | ICD-10-CM | POA: Diagnosis not present

## 2021-10-23 DIAGNOSIS — I5023 Acute on chronic systolic (congestive) heart failure: Secondary | ICD-10-CM | POA: Diagnosis not present

## 2021-10-23 DIAGNOSIS — I5022 Chronic systolic (congestive) heart failure: Secondary | ICD-10-CM

## 2021-10-23 DIAGNOSIS — Z7982 Long term (current) use of aspirin: Secondary | ICD-10-CM | POA: Insufficient documentation

## 2021-10-23 DIAGNOSIS — I130 Hypertensive heart and chronic kidney disease with heart failure and stage 1 through stage 4 chronic kidney disease, or unspecified chronic kidney disease: Secondary | ICD-10-CM | POA: Insufficient documentation

## 2021-10-23 DIAGNOSIS — I340 Nonrheumatic mitral (valve) insufficiency: Secondary | ICD-10-CM | POA: Insufficient documentation

## 2021-10-23 DIAGNOSIS — R7989 Other specified abnormal findings of blood chemistry: Secondary | ICD-10-CM

## 2021-10-23 DIAGNOSIS — I25118 Atherosclerotic heart disease of native coronary artery with other forms of angina pectoris: Secondary | ICD-10-CM | POA: Insufficient documentation

## 2021-10-23 DIAGNOSIS — N189 Chronic kidney disease, unspecified: Secondary | ICD-10-CM | POA: Insufficient documentation

## 2021-10-23 DIAGNOSIS — E785 Hyperlipidemia, unspecified: Secondary | ICD-10-CM | POA: Insufficient documentation

## 2021-10-23 HISTORY — PX: RIGHT/LEFT HEART CATH AND CORONARY ANGIOGRAPHY: CATH118266

## 2021-10-23 LAB — POCT I-STAT EG7
Acid-Base Excess: 1 mmol/L (ref 0.0–2.0)
Acid-Base Excess: 3 mmol/L — ABNORMAL HIGH (ref 0.0–2.0)
Acid-Base Excess: 3 mmol/L — ABNORMAL HIGH (ref 0.0–2.0)
Bicarbonate: 26.2 mmol/L (ref 20.0–28.0)
Bicarbonate: 29 mmol/L — ABNORMAL HIGH (ref 20.0–28.0)
Bicarbonate: 29.3 mmol/L — ABNORMAL HIGH (ref 20.0–28.0)
Calcium, Ion: 1.2 mmol/L (ref 1.15–1.40)
Calcium, Ion: 1.23 mmol/L (ref 1.15–1.40)
Calcium, Ion: 1.24 mmol/L (ref 1.15–1.40)
HCT: 38 % — ABNORMAL LOW (ref 39.0–52.0)
HCT: 39 % (ref 39.0–52.0)
HCT: 39 % (ref 39.0–52.0)
Hemoglobin: 12.9 g/dL — ABNORMAL LOW (ref 13.0–17.0)
Hemoglobin: 13.3 g/dL (ref 13.0–17.0)
Hemoglobin: 13.3 g/dL (ref 13.0–17.0)
O2 Saturation: 64 %
O2 Saturation: 65 %
O2 Saturation: 97 %
Potassium: 3.9 mmol/L (ref 3.5–5.1)
Potassium: 4 mmol/L (ref 3.5–5.1)
Potassium: 4.1 mmol/L (ref 3.5–5.1)
Sodium: 143 mmol/L (ref 135–145)
Sodium: 144 mmol/L (ref 135–145)
Sodium: 145 mmol/L (ref 135–145)
TCO2: 28 mmol/L (ref 22–32)
TCO2: 31 mmol/L (ref 22–32)
TCO2: 31 mmol/L (ref 22–32)
pCO2, Ven: 43.7 mmHg — ABNORMAL LOW (ref 44–60)
pCO2, Ven: 50.5 mmHg (ref 44–60)
pCO2, Ven: 50.9 mmHg (ref 44–60)
pH, Ven: 7.367 (ref 7.25–7.43)
pH, Ven: 7.367 (ref 7.25–7.43)
pH, Ven: 7.386 (ref 7.25–7.43)
pO2, Ven: 35 mmHg (ref 32–45)
pO2, Ven: 35 mmHg (ref 32–45)
pO2, Ven: 89 mmHg — ABNORMAL HIGH (ref 32–45)

## 2021-10-23 LAB — GLUCOSE, CAPILLARY
Comment 1: 18879298
Glucose-Capillary: 166 mg/dL — ABNORMAL HIGH (ref 70–99)

## 2021-10-23 SURGERY — RIGHT/LEFT HEART CATH AND CORONARY ANGIOGRAPHY
Anesthesia: LOCAL

## 2021-10-23 MED ORDER — HEPARIN SODIUM (PORCINE) 1000 UNIT/ML IJ SOLN
INTRAMUSCULAR | Status: AC
  Filled 2021-10-23: qty 10

## 2021-10-23 MED ORDER — HEPARIN SODIUM (PORCINE) 1000 UNIT/ML IJ SOLN
INTRAMUSCULAR | Status: DC | PRN
  Administered 2021-10-23: 5000 [IU] via INTRAVENOUS

## 2021-10-23 MED ORDER — FENTANYL CITRATE (PF) 100 MCG/2ML IJ SOLN
INTRAMUSCULAR | Status: AC
  Filled 2021-10-23: qty 2

## 2021-10-23 MED ORDER — LIDOCAINE HCL (PF) 1 % IJ SOLN
INTRAMUSCULAR | Status: AC
  Filled 2021-10-23: qty 30

## 2021-10-23 MED ORDER — MIDAZOLAM HCL 2 MG/2ML IJ SOLN
INTRAMUSCULAR | Status: AC
  Filled 2021-10-23: qty 2

## 2021-10-23 MED ORDER — SODIUM CHLORIDE 0.9 % IV SOLN: INTRAVENOUS | Status: DC

## 2021-10-23 MED ORDER — VERAPAMIL HCL 2.5 MG/ML IV SOLN
INTRAVENOUS | Status: DC | PRN
  Administered 2021-10-23: 10 mL via INTRA_ARTERIAL

## 2021-10-23 MED ORDER — HEPARIN (PORCINE) IN NACL 1000-0.9 UT/500ML-% IV SOLN
INTRAVENOUS | Status: AC
  Filled 2021-10-23: qty 500

## 2021-10-23 MED ORDER — FENTANYL CITRATE (PF) 100 MCG/2ML IJ SOLN
INTRAMUSCULAR | Status: DC | PRN
Start: 2021-10-23 — End: 2021-10-23
  Administered 2021-10-23: 25 ug via INTRAVENOUS

## 2021-10-23 MED ORDER — IOHEXOL 350 MG/ML SOLN
INTRAVENOUS | Status: DC | PRN
  Administered 2021-10-23: 35 mL

## 2021-10-23 MED ORDER — ACETAMINOPHEN 325 MG PO TABS: 650.0000 mg | ORAL_TABLET | ORAL | Status: DC | PRN

## 2021-10-23 MED ORDER — VERAPAMIL HCL 2.5 MG/ML IV SOLN
INTRAVENOUS | Status: AC
  Filled 2021-10-23: qty 2

## 2021-10-23 MED ORDER — SODIUM CHLORIDE 0.9 % IV SOLN: 250.0000 mL | INTRAVENOUS | Status: DC | PRN

## 2021-10-23 MED ORDER — ASPIRIN 81 MG PO CHEW: 81.0000 mg | CHEWABLE_TABLET | ORAL | Status: DC

## 2021-10-23 MED ORDER — HEPARIN (PORCINE) IN NACL 1000-0.9 UT/500ML-% IV SOLN
INTRAVENOUS | Status: DC | PRN
  Administered 2021-10-23 (×2): 500 mL

## 2021-10-23 MED ORDER — LIDOCAINE HCL (PF) 1 % IJ SOLN
INTRAMUSCULAR | Status: DC | PRN
  Administered 2021-10-23 (×2): 2 mL

## 2021-10-23 MED ORDER — SODIUM CHLORIDE 0.9% FLUSH: 3.0000 mL | Freq: Two times a day (BID) | INTRAVENOUS | Status: DC

## 2021-10-23 MED ORDER — HYDRALAZINE HCL 20 MG/ML IJ SOLN: 10.0000 mg | INTRAMUSCULAR | Status: DC | PRN

## 2021-10-23 MED ORDER — ONDANSETRON HCL 4 MG/2ML IJ SOLN: 4.0000 mg | Freq: Four times a day (QID) | INTRAMUSCULAR | Status: DC | PRN

## 2021-10-23 MED ORDER — LABETALOL HCL 5 MG/ML IV SOLN: 10.0000 mg | INTRAVENOUS | Status: DC | PRN

## 2021-10-23 MED ORDER — METFORMIN HCL 850 MG PO TABS: 850.0000 mg | ORAL_TABLET | Freq: Two times a day (BID) | ORAL | 2 refills | Status: DC

## 2021-10-23 MED ORDER — SODIUM CHLORIDE 0.9% FLUSH: 3.0000 mL | INTRAVENOUS | Status: DC | PRN

## 2021-10-23 MED ORDER — MIDAZOLAM HCL 2 MG/2ML IJ SOLN
INTRAMUSCULAR | Status: DC | PRN
  Administered 2021-10-23 (×2): 1 mg via INTRAVENOUS

## 2021-10-23 MED ORDER — SODIUM CHLORIDE 0.9 % IV SOLN: INTRAVENOUS | Status: AC

## 2021-10-23 SURGICAL SUPPLY — 14 items
BAND CMPR LRG ZPHR (HEMOSTASIS) ×1
BAND ZEPHYR COMPRESS 30 LONG (HEMOSTASIS) ×1 IMPLANT
CATH 5FR JL3.5 JR4 ANG PIG MP (CATHETERS) ×1 IMPLANT
CATH BALLN WEDGE 5F 110CM (CATHETERS) ×1 IMPLANT
ELECT DEFIB PAD ADLT CADENCE (PAD) ×1 IMPLANT
GLIDESHEATH SLEND SS 6F .021 (SHEATH) ×1 IMPLANT
GUIDEWIRE INQWIRE 1.5J.035X260 (WIRE) IMPLANT
INQWIRE 1.5J .035X260CM (WIRE) ×2
KIT HEART LEFT (KITS) ×2 IMPLANT
PACK CARDIAC CATHETERIZATION (CUSTOM PROCEDURE TRAY) ×2 IMPLANT
SHEATH GLIDE SLENDER 4/5FR (SHEATH) ×1 IMPLANT
SHEATH PROBE COVER 6X72 (BAG) IMPLANT
TRANSDUCER W/STOPCOCK (MISCELLANEOUS) ×2 IMPLANT
TUBING CIL FLEX 10 FLL-RA (TUBING) ×2 IMPLANT

## 2021-10-23 NOTE — Telephone Encounter (Signed)
The patient has been notified of the result and verbalized understanding.  All questions (if any) were answered.  Pt states he never started taking Entresto and will continue to hold off on taking this medication, unless otherwise advised by Dr. [place] at his upcoming appt with him on 6/26.  Pt did state he was advised at discharge today to continue holding his metformin, due to elevated creatinine.   Pt is scheduled for a follow-up visit with Dr. [place] on 10/30/21 at 0830.   Scheduled the pt a lab appt here at our office for this Friday 6/23.    Will route this message to Dr. Shari Prows and Dr. [place] as an Lorain Childes, to make them aware that pt never started taking Entresto, and he will continue holding unless Dr. [place] advises otherwise on his 6/26 OV appt with him.  He will continue holding metformin as well and BMET on 6/23.  Pt verbalized understanding and agrees with this plan.

## 2021-10-23 NOTE — Interval H&P Note (Signed)
Cath Lab Visit (complete for each Cath Lab visit)  Clinical Evaluation Leading to the Procedure:   ACS: No.  Non-ACS:    Anginal Classification: CCS III  Anti-ischemic medical therapy: Minimal Therapy (1 class of medications)  Non-Invasive Test Results: High-risk stress test findings: cardiac mortality >3%/year  Prior CABG: No previous CABG   High risk echo due to low EF   History and Physical Interval Note:  10/23/2021 8:50 AM  Eric Grant  has presented today for surgery, with the diagnosis of heart failure.  The various methods of treatment have been discussed with the patient and family. After consideration of risks, benefits and other options for treatment, the patient has consented to  Procedure(s): RIGHT/LEFT HEART CATH AND CORONARY ANGIOGRAPHY (N/A) as a surgical intervention.  The patient's history has been reviewed, patient examined, no change in status, stable for surgery.  I have reviewed the patient's chart and labs.  Questions were answered to the patient's satisfaction.     Lance Muss

## 2021-10-23 NOTE — Telephone Encounter (Signed)
-----   Message from Meriam Sprague, MD sent at 10/23/2021 12:09 PM EDT ----- His cath showed multivessel CAD with normal filling pressures. He is not a great CABG candidate so we are planning to medically optimize as much as possible. Can we get him in to see Dr. Swaziland for follow-up if not already scheduled and order a repeat BMET for the end of the week. Will hold entresto until his Cr returns to baseline and he is seen by MD. We can always re-challenge in the future or go back to [facility] alone.   Thank you! -Herbert Seta ----- Message ----- From: Loa Socks, LPN Sent: 2/62/0355   8:37 AM EDT To: Meriam Sprague, MD  He is getting cath right now-please advise  ----- Message ----- From: Meriam Sprague, MD Sent: 10/22/2021   7:29 PM EDT To: Loa Socks, LPN  His Cr increased quite a bit with the entresto. Let's have him hold it for now and we can retrial once his cath is over and we get a better idea of his filling pressures.

## 2021-10-23 NOTE — Telephone Encounter (Signed)
Isabel, Freese - 10/23/2021  1:25 PM Meriam Sprague, MD  Sent: Mon October 23, 2021  2:45 PM  To: Loa Socks, LPN; Swaziland, Peter M, MD          Message  Thank you so much for letting us know. Hopefully the Cr levels out and we can sneak it on.

## 2021-10-23 NOTE — Progress Notes (Signed)
Pt ambulated without difficulty or bleeding.   Discharged home with friend who will drive and stay with pt x 24 hrs  

## 2021-10-24 NOTE — Telephone Encounter (Signed)
Patient's friend is following up, requesting to review results and recommendations again if possible.

## 2021-10-24 NOTE — Telephone Encounter (Signed)
RN spoke to Jane Lew - RN spoke to patient and got a verbal okay to speak to [place]. RN informed patient he will need to sign the DPR/[place] form when he see Dr Swaziland on October 30, 2021.   Ms Brooke Pace want to go over patient 's medication  to restart or continue since  right and left heart cath from yesterday.  Metformin  will restart on Jun 22 Per notation by Dr Shari Prows- patient is to not take Entresto until [facility] lab is done on 10/27/21 Friday.  Patient has an appointment on 10/30/21 with Dr Swaziland .  Result will be  reviewed at that time .  Can continue taking  furosemide, spironolactone, crestor   Ms Renita Papa and patient verbalized understanding.

## 2021-10-27 ENCOUNTER — Other Ambulatory Visit: Payer: Medicare HMO

## 2021-10-27 DIAGNOSIS — I5022 Chronic systolic (congestive) heart failure: Secondary | ICD-10-CM

## 2021-10-27 DIAGNOSIS — Z79899 Other long term (current) drug therapy: Secondary | ICD-10-CM | POA: Diagnosis not present

## 2021-10-27 DIAGNOSIS — R7989 Other specified abnormal findings of blood chemistry: Secondary | ICD-10-CM | POA: Diagnosis not present

## 2021-10-27 DIAGNOSIS — E119 Type 2 diabetes mellitus without complications: Secondary | ICD-10-CM

## 2021-10-28 LAB — BASIC METABOLIC PANEL
BUN/Creatinine Ratio: 14 (ref 10–24)
BUN: 23 mg/dL (ref 8–27)
CO2: 19 mmol/L — ABNORMAL LOW (ref 20–29)
Calcium: 9 mg/dL (ref 8.6–10.2)
Chloride: 105 mmol/L (ref 96–106)
Creatinine, Ser: 1.67 mg/dL — ABNORMAL HIGH (ref 0.76–1.27)
Glucose: 136 mg/dL — ABNORMAL HIGH (ref 70–99)
Potassium: 4.2 mmol/L (ref 3.5–5.2)
Sodium: 140 mmol/L (ref 134–144)
eGFR: 41 mL/min/{1.73_m2} — ABNORMAL LOW (ref >59)

## 2021-10-30 ENCOUNTER — Encounter: Payer: Self-pay | Admitting: Cardiology

## 2021-10-30 ENCOUNTER — Ambulatory Visit: Payer: Medicare HMO | Admitting: Cardiology

## 2021-10-30 VITALS — BP 103/58 | HR 61 | Ht 71.0 in | Wt 204.8 lb

## 2021-10-30 DIAGNOSIS — E119 Type 2 diabetes mellitus without complications: Secondary | ICD-10-CM

## 2021-10-30 DIAGNOSIS — I340 Nonrheumatic mitral (valve) insufficiency: Secondary | ICD-10-CM | POA: Diagnosis not present

## 2021-10-30 DIAGNOSIS — I25118 Atherosclerotic heart disease of native coronary artery with other forms of angina pectoris: Secondary | ICD-10-CM | POA: Diagnosis not present

## 2021-10-30 DIAGNOSIS — N1832 Chronic kidney disease, stage 3b: Secondary | ICD-10-CM | POA: Diagnosis not present

## 2021-10-30 DIAGNOSIS — I5022 Chronic systolic (congestive) heart failure: Secondary | ICD-10-CM

## 2021-10-30 MED ORDER — EMPAGLIFLOZIN 10 MG PO TABS: 10.0000 mg | ORAL_TABLET | Freq: Every day | ORAL | 3 refills | Status: AC

## 2021-11-08 ENCOUNTER — Ambulatory Visit: Payer: Medicare HMO | Admitting: Physician Assistant

## 2021-11-08 ENCOUNTER — Ambulatory Visit (INDEPENDENT_AMBULATORY_CARE_PROVIDER_SITE_OTHER): Payer: Medicare HMO | Admitting: Podiatry

## 2021-11-08 ENCOUNTER — Encounter: Payer: Self-pay | Admitting: Podiatry

## 2021-11-08 ENCOUNTER — Encounter: Payer: Self-pay | Admitting: Physician Assistant

## 2021-11-08 VITALS — BP 113/76 | HR 80 | Ht 71.0 in | Wt 205.0 lb

## 2021-11-08 DIAGNOSIS — Z794 Long term (current) use of insulin: Secondary | ICD-10-CM

## 2021-11-08 DIAGNOSIS — B351 Tinea unguium: Secondary | ICD-10-CM

## 2021-11-08 DIAGNOSIS — I10 Essential (primary) hypertension: Secondary | ICD-10-CM

## 2021-11-08 DIAGNOSIS — I255 Ischemic cardiomyopathy: Secondary | ICD-10-CM | POA: Diagnosis not present

## 2021-11-08 DIAGNOSIS — M79609 Pain in unspecified limb: Secondary | ICD-10-CM

## 2021-11-08 DIAGNOSIS — N183 Chronic kidney disease, stage 3 unspecified: Secondary | ICD-10-CM

## 2021-11-08 DIAGNOSIS — I2510 Atherosclerotic heart disease of native coronary artery without angina pectoris: Secondary | ICD-10-CM | POA: Diagnosis not present

## 2021-11-08 DIAGNOSIS — E785 Hyperlipidemia, unspecified: Secondary | ICD-10-CM

## 2021-11-08 DIAGNOSIS — E119 Type 2 diabetes mellitus without complications: Secondary | ICD-10-CM | POA: Diagnosis not present

## 2021-11-08 NOTE — Progress Notes (Signed)
This patient returns to my office for at risk foot care.  This patient requires this care by a professional since this patient will be at risk due to having diabetes.  This patient is unable to cut nails himself since the patient cannot reach his nails.These nails are painful walking and wearing shoes.  This patient presents for at risk foot care today.  General Appearance  Alert, conversant and in no acute stress.  Vascular  Dorsalis pedis and posterior tibial  pulses are  weakly palpable  bilaterally.  Capillary return is within normal limits  bilaterally. Temperature is within normal limits  bilaterally.  Neurologic  Senn-Weinstein monofilament wire test within normal limits  bilaterally. Muscle power within normal limits bilaterally.  Nails Thick disfigured discolored nails with subungual debris  from hallux to fifth toes bilaterally. No evidence of bacterial infection or drainage bilaterally.  Orthopedic  No limitations of motion  feet .  No crepitus or effusions noted.  No bony pathology or digital deformities noted.  Skin  normotropic skin with no porokeratosis noted bilaterally.  No signs of infections or ulcers noted.     Onychomycosis  Pain in right toes  Pain in left toes  Consent was obtained for treatment procedures.   Mechanical debridement of nails 1-5  bilaterally performed with a nail nipper.  Filed with dremel without incident.    Return office visit     3 months                 Told patient to return for periodic foot care and evaluation due to potential at risk complications.   Keylee Shrestha DPM   

## 2021-11-08 NOTE — Progress Notes (Signed)
Cardiology Office Note:    Date:  11/10/2021   ID:  Eric Grant, DOB 08-08-1940, MRN [id number]  PCP:  [name], [place] [facility] Cardiologist:  Peter Martinique, MD     Referring MD: [name], MD   Chief Complaint  Patient presents with   Follow-up    Seen for Dr. Martinique    History of Present Illness:    Eric Grant is a 81 y.o. male with a hx of CAD, ischemic cardiomyopathy, hypertension, hyperlipidemia, DM2 on insulin and CKD.  He had a Myoview in 2007 that showed inferolateral scar, EF 34%.  Echocardiogram in 2011 showed inferior and inferolateral scar with EF 35 to 40%.  Patient deferred cardiac cath in the past.  He was evaluated recently due to dyspnea on exertion, he denied any chest pain.  Echocardiogram shows further decrease of EF down to 15% with RV dysfunction and severe MR as well.  He was started on Entresto and spironolactone.  Carvedilol dose was reduced.  Left and right heart cath performed on 10/23/2021 showed 75% left main, 75% mid left circumflex, subtotal occluded RCA.  Right heart cath pressure was normal.  It was recommended to treat CAD medically.  If his EF does not improve with optimal medical therapy, may consider CABG in the future however likely will require viability study prior to it.  Patient was last seen by Dr. Martinique on 10/30/2021, Vania Rea was added to his medical regimen on top of carvedilol, Entresto and spironolactone.  Actos was discontinued.  Patient presents today for follow-up.  He has tolerated Jardiance okay.  He denies any chest pain or worsening dyspnea.  Lower extremity edema has resolved.  He does occasionally notice dizziness early in the morning, however this is nonsignificant and presentation is quite rare.  I will obtain basic metabolic panel today to make sure he is not being dehydrated.  EKG does show frequent PVCs, his carvedilol was previously cut back due to severe LV dysfunction.  If blood work is  normal, will consider either increase Entresto or carvedilol.  After that, I do not think he will have enough blood pressure room for further titration, I would recommend a repeat echocardiogram in 3 months prior to follow-up with Dr. Martinique.  Past Medical History:  Diagnosis Date   Seizures ([place]) 1991   Urinary tract infection 07/2012    Past Surgical History:  Procedure Laterality Date   RIGHT/LEFT HEART CATH AND CORONARY ANGIOGRAPHY N/A 10/23/2021   Procedure: RIGHT/LEFT HEART CATH AND CORONARY ANGIOGRAPHY;  Surgeon: [name], MD;  Location: [facility];  Service: Cardiovascular;  Laterality: N/A;    Current Medications: Current Meds  Medication Sig   Accu-Chek FastClix Lancets MISC USE  TO CHECK BLOOD SUGAR FOUR TIMES DAILY   albuterol (VENTOLIN HFA) 108 (90 Base) MCG/ACT inhaler Inhale 2 puffs into the lungs every 6 (six) hours as needed for wheezing or shortness of breath.   Alcohol Swabs 70 % PADS Use as directed   allopurinol (ZYLOPRIM) 100 MG tablet TAKE 1 TABLET EVERY DAY (Patient taking differently: Take 100 mg by mouth every evening.)   aspirin EC 81 MG tablet Take 81 mg by mouth in the morning.   Blood Glucose Calibration (ACCU-CHEK AVIVA) SOLN Use as instructed.   Blood Glucose Calibration (TRUE METRIX LEVEL 1) Low SOLN USE TO CALIBRATE GLUCOMETER AS DIRECTED   Blood Glucose Monitoring Suppl (ACCU-CHEK GUIDE) w/Device KIT USE AS DIRECTED   carvedilol (COREG) 6.25  MG tablet Take 1 tablet (6.25 mg total) by mouth 2 (two) times daily.   empagliflozin (JARDIANCE) 10 MG TABS tablet Take 1 tablet (10 mg total) by mouth daily before breakfast.   Insulin Pen Needle (DROPLET PEN NEEDLES) 31G X 8 MM MISC USE 3 DAILY   NOVOLOG FLEXPEN 100 UNIT/ML FlexPen INJECT 4 UNITS SUBCUTANEOUSLY AT BREAKFAST AND 14 TO 15 UNITS  AT SUPPER   potassium chloride (KLOR-CON M) 10 MEQ tablet Take 1 tablet (10 mEq total) by mouth twice daily for 3 days only, then decrease to taking 1  tablet (10 mEq total) by mouth daily thereafter.   rosuvastatin (CRESTOR) 20 MG tablet TAKE 1 TABLET EVERY DAY   sacubitril-valsartan (ENTRESTO) 24-26 MG Take 1 tablet by mouth 2 (two) times daily.   spironolactone (ALDACTONE) 25 MG tablet Take 0.5 tablets (12.5 mg total) by mouth daily.   TRESIBA FLEXTOUCH 100 UNIT/ML FlexTouch Pen INJECT 14 UNITS UNDER THE SKIN 1 TIME DAILY (Patient taking differently: 14 Units at bedtime.)   TRUE METRIX BLOOD GLUCOSE TEST test strip TEST FOUR TIMES DAILY   TRUEplus Lancets 28G MISC TEST BLOOD SUGAR FOUR TIMES DAILY   [DISCONTINUED] furosemide (LASIX) 40 MG tablet Take 1 tablet (40 mg total) by mouth twice daily for 3 days only, then take 1 tablet (40 mg total) by mouth daily thereafter.     Allergies:   Patient has no known allergies.   Social History   Socioeconomic History   Marital status: Single    Spouse name: Not on file   Number of children: Not on file   Years of education: Not on file   Highest education level: Not on file  Occupational History   Not on file  Tobacco Use   Smoking status: Never   Smokeless tobacco: Never  Substance and Sexual Activity   Alcohol use: Not on file   Drug use: Not on file   Sexual activity: Not on file  Other Topics Concern   Not on file  Social History Narrative   Not on file   Social Determinants of Health   Financial Resource Strain: Not on file  Food Insecurity: Not on file  Transportation Needs: Not on file  Physical Activity: Not on file  Stress: Not on file  Social Connections: Not on file     Family History: The patient's family history includes Diabetes in his father and mother; Hyperlipidemia in his mother; Hypertension in his father and mother. There is no history of Cancer.  ROS:   Please see the history of present illness.     All other systems reviewed and are negative.  EKGs/Labs/Other Studies Reviewed:    The following studies were reviewed today:  Echo 10/16/2021  1.  Left ventricular ejection fraction, by estimation, is 10-15%. The left  ventricle has severely decreased function. The left ventricle demonstrates  global hypokinesis. The left ventricular internal cavity size was mildly  dilated. Left ventricular  diastolic parameters are consistent with Grade II diastolic dysfunction  (pseudonormalization).   2. Right ventricular systolic function is severely reduced. The right  ventricular size is moderately enlarged. There is mildly elevated  pulmonary artery systolic pressure. The estimated right ventricular  systolic pressure is 12.1 mmHg.   3. Left atrial size was moderately dilated.   4. Severe MR secondary to restricted PMVL in systole (IIIB). This is  secondary to cardiomyopathy. The mitral valve is abnormal. Severe mitral  valve regurgitation.   5. The aortic valve is tricuspid.  Aortic valve regurgitation is not  visualized. No aortic stenosis is present.   6. The inferior vena cava is normal in size with <50% respiratory  variability, suggesting right atrial pressure of 8 mmHg.   EKG:  EKG is not ordered today.    Recent Labs: 10/20/2021: Platelets 256 10/23/2021: Hemoglobin 13.3; Hemoglobin 13.3 11/08/2021: BUN 34; Creatinine, Ser 1.82; Potassium 5.3; Sodium 141  Recent Lipid Panel    Component Value Date/Time   CHOL 97 08/17/2020 1347   TRIG 74.0 08/17/2020 1347   HDL 37.90 (L) 08/17/2020 1347   CHOLHDL 3 08/17/2020 1347   VLDL 14.8 08/17/2020 1347   LDLCALC 44 08/17/2020 1347     Risk Assessment/Calculations:           Physical Exam:    VS:  BP 113/76   Pulse 80   Ht _0  (1.803 m)   Wt 205 lb (93 kg)   SpO2 96%   BMI 28.59 kg/m     Wt Readings from Last 3 Encounters:  11/08/21 205 lb (93 kg)  10/30/21 204 lb 12.8 oz (92.9 kg)  10/23/21 220 lb (99.8 kg)     GEN:  Well nourished, well developed in no acute distress HEENT: Normal NECK: No JVD; No carotid bruits LYMPHATICS: No lymphadenopathy CARDIAC: RRR, no  murmurs, rubs, gallops RESPIRATORY:  Clear to auscultation without rales, wheezing or rhonchi  ABDOMEN: Soft, non-tender, non-distended MUSCULOSKELETAL:  No edema; No deformity  SKIN: Warm and dry NEUROLOGIC:  Alert and oriented x 3 PSYCHIATRIC:  Normal affect   ASSESSMENT:    1. Coronary artery disease involving native coronary artery of native heart without angina pectoris   2. Stage 3 chronic kidney disease, unspecified whether stage 3a or 3b CKD ([place])   3. Ischemic cardiomyopathy   4. Primary hypertension   5. Hyperlipidemia LDL goal <70   6. Controlled type 2 diabetes mellitus without complication, with long-term current use of insulin (HCC)    PLAN:    In order of problems listed above:  CAD: Medical management for now.  Patient does have severe underlying CAD.  If ejection fraction does not improve after placing on goal-directed medical therapy, may consider CABG, however will require viability test prior to that.  CKD stage III: Obtain basic metabolic panel  Ischemic cardiomyopathy: Carvedilol was previously reduced due to severely low EF of 15%, currently on Entresto and spironolactone as well.  Obtain basic metabolic panel today.  If blood work shows stable renal function, may increase Entresto dosing  Hypertension: Blood pressure stable today, however will attempt to increase Entresto dose if renal function allows  Hyperlipidemia: On Crestor  DM2: Managed by primary care provider.  On insulin           Medication Adjustments/Labs and Tests Ordered: Current medicines are reviewed at length with the patient today.  Concerns regarding medicines are outlined above.  Orders Placed This Encounter  Procedures   Basic metabolic panel   ECHOCARDIOGRAM COMPLETE   No orders of the defined types were placed in this encounter.   Patient Instructions  Medication Instructions:  Your physician recommends that you continue on your current medications as directed. Please  refer to the Current Medication list given to you today.  *If you need a refill on your cardiac medications before your next appointment, please call your pharmacy*  Lab Work: Your physician recommends that you return for lab work TODAY:  BMET  If you have labs (blood work) drawn today and your  tests are completely normal, you will receive your results only by: MyChart Message (if you have MyChart) OR A paper copy in the mail If you have any lab test that is abnormal or we need to change your treatment, we will call you to review the results.  Testing/Procedures: Your physician has requested that you have an echocardiogram. Echocardiography is a painless test that uses sound waves to create images of your heart. It provides your doctor with information about the size and shape of your heart and how well your heart's chambers and valves are working. This procedure takes approximately one hour. There are no restrictions for this procedure. This test is performed at [address] [place], [place] [address]  Please schedule for 3 months   Follow-Up: At [facility], you and your health needs are our priority.  As part of our continuing mission to provide you with exceptional heart care, we have created designated Provider Care Teams.  These Care Teams include your primary Cardiologist (physician) and Advanced Practice Providers (APPs -  Physician Assistants and Nurse Practitioners) who all work together to provide you with the care you need, when you need it.  We recommend signing up for the patient portal called "MyChart".  Sign up information is provided on this After Visit Summary.  MyChart is used to connect with patients for Virtual Visits (Telemedicine).  Patients are able to view lab/test results, encounter notes, upcoming appointments, etc.  Non-urgent messages can be sent to your provider as well.   To learn more about what you can do with MyChart, go to [email or web address].     Your next appointment:   3 month(s)  The format for your next appointment:   In Person  Provider:   Peter Martinique, MD \    Other Instructions   Important Information About Sugar         Hilbert Corrigan, [place]  11/10/2021 11:33 PM    [place]

## 2021-11-08 NOTE — Patient Instructions (Addendum)
Medication Instructions:  Your physician recommends that you continue on your current medications as directed. Please refer to the Current Medication list given to you today.  *If you need a refill on your cardiac medications before your next appointment, please call your pharmacy*  Lab Work: Your physician recommends that you return for lab work TODAY:  BMET  If you have labs (blood work) drawn today and your tests are completely normal, you will receive your results only by: MyChart Message (if you have MyChart) OR A paper copy in the mail If you have any lab test that is abnormal or we need to change your treatment, we will call you to review the results.  Testing/Procedures: Your physician has requested that you have an echocardiogram. Echocardiography is a painless test that uses sound waves to create images of your heart. It provides your doctor with information about the size and shape of your heart and how well your heart's chambers and valves are working. This procedure takes approximately one hour. There are no restrictions for this procedure. This test is performed at [address] [place], [place] [address]  Please schedule for 3 months   Follow-Up: At [facility], you and your health needs are our priority.  As part of our continuing mission to provide you with exceptional heart care, we have created designated Provider Care Teams.  These Care Teams include your primary Cardiologist (physician) and Advanced Practice Providers (APPs -  Physician Assistants and Nurse Practitioners) who all work together to provide you with the care you need, when you need it.  We recommend signing up for the patient portal called "MyChart".  Sign up information is provided on this After Visit Summary.  MyChart is used to connect with patients for Virtual Visits (Telemedicine).  Patients are able to view lab/test results, encounter notes, upcoming appointments, etc.  Non-urgent messages can  be sent to your provider as well.   To learn more about what you can do with MyChart, go to [email or web address].    Your next appointment:   3 month(s)  The format for your next appointment:   In Person  Provider:   Peter Swaziland, MD \    Other Instructions   Important Information About Sugar

## 2021-11-09 ENCOUNTER — Other Ambulatory Visit: Payer: Self-pay

## 2021-11-09 DIAGNOSIS — Z79899 Other long term (current) drug therapy: Secondary | ICD-10-CM

## 2021-11-09 DIAGNOSIS — Z01812 Encounter for preprocedural laboratory examination: Secondary | ICD-10-CM

## 2021-11-09 DIAGNOSIS — I5023 Acute on chronic systolic (congestive) heart failure: Secondary | ICD-10-CM

## 2021-11-09 LAB — BASIC METABOLIC PANEL
BUN/Creatinine Ratio: 19 (ref 10–24)
BUN: 34 mg/dL — ABNORMAL HIGH (ref 8–27)
CO2: 23 mmol/L (ref 20–29)
Calcium: 9.8 mg/dL (ref 8.6–10.2)
Chloride: 103 mmol/L (ref 96–106)
Creatinine, Ser: 1.82 mg/dL — ABNORMAL HIGH (ref 0.76–1.27)
Glucose: 79 mg/dL (ref 70–99)
Potassium: 5.3 mmol/L — ABNORMAL HIGH (ref 3.5–5.2)
Sodium: 141 mmol/L (ref 134–144)
eGFR: 37 mL/min/{1.73_m2} — ABNORMAL LOW (ref >59)

## 2021-11-09 MED ORDER — FUROSEMIDE 40 MG PO TABS: ORAL_TABLET | ORAL | 3 refills | Status: DC

## 2021-11-10 ENCOUNTER — Encounter: Payer: Self-pay | Admitting: Physician Assistant

## 2021-11-13 ENCOUNTER — Other Ambulatory Visit: Payer: Self-pay | Admitting: Endocrinology

## 2021-11-13 ENCOUNTER — Other Ambulatory Visit: Payer: Self-pay

## 2021-11-13 DIAGNOSIS — Z79899 Other long term (current) drug therapy: Secondary | ICD-10-CM

## 2021-11-13 DIAGNOSIS — Z01812 Encounter for preprocedural laboratory examination: Secondary | ICD-10-CM

## 2021-11-13 DIAGNOSIS — I5023 Acute on chronic systolic (congestive) heart failure: Secondary | ICD-10-CM

## 2021-11-13 MED ORDER — FUROSEMIDE 40 MG PO TABS: ORAL_TABLET | ORAL | 3 refills | Status: DC

## 2021-11-13 NOTE — Addendum Note (Signed)
Addended by: Maryjean Ka A on: 11/13/2021 12:56 PM   Modules accepted: Orders

## 2021-11-13 NOTE — Telephone Encounter (Signed)
This is Dr. Jordan's pt. °

## 2021-11-17 ENCOUNTER — Other Ambulatory Visit: Payer: Self-pay

## 2021-11-17 DIAGNOSIS — I5023 Acute on chronic systolic (congestive) heart failure: Secondary | ICD-10-CM

## 2021-11-17 DIAGNOSIS — Z79899 Other long term (current) drug therapy: Secondary | ICD-10-CM

## 2021-11-17 DIAGNOSIS — Z01812 Encounter for preprocedural laboratory examination: Secondary | ICD-10-CM

## 2021-11-17 MED ORDER — FUROSEMIDE 20 MG PO TABS: ORAL_TABLET | ORAL | 0 refills | Status: DC

## 2021-11-20 ENCOUNTER — Other Ambulatory Visit: Payer: Self-pay

## 2021-11-20 DIAGNOSIS — Z79899 Other long term (current) drug therapy: Secondary | ICD-10-CM

## 2021-11-20 DIAGNOSIS — I5023 Acute on chronic systolic (congestive) heart failure: Secondary | ICD-10-CM

## 2021-11-20 DIAGNOSIS — Z01812 Encounter for preprocedural laboratory examination: Secondary | ICD-10-CM

## 2021-11-20 MED ORDER — FUROSEMIDE 20 MG PO TABS: ORAL_TABLET | ORAL | 3 refills | Status: AC

## 2021-11-22 ENCOUNTER — Other Ambulatory Visit: Payer: Medicare HMO

## 2021-11-22 DIAGNOSIS — Z79899 Other long term (current) drug therapy: Secondary | ICD-10-CM | POA: Diagnosis not present

## 2021-11-22 LAB — BASIC METABOLIC PANEL
BUN/Creatinine Ratio: 19 (ref 10–24)
BUN: 33 mg/dL — ABNORMAL HIGH (ref 8–27)
CO2: 21 mmol/L (ref 20–29)
Calcium: 9.3 mg/dL (ref 8.6–10.2)
Chloride: 104 mmol/L (ref 96–106)
Creatinine, Ser: 1.72 mg/dL — ABNORMAL HIGH (ref 0.76–1.27)
Glucose: 113 mg/dL — ABNORMAL HIGH (ref 70–99)
Potassium: 4.5 mmol/L (ref 3.5–5.2)
Sodium: 139 mmol/L (ref 134–144)
eGFR: 39 mL/min/{1.73_m2} — ABNORMAL LOW (ref >59)

## 2021-11-24 ENCOUNTER — Other Ambulatory Visit: Payer: Self-pay

## 2021-11-24 DIAGNOSIS — Z01812 Encounter for preprocedural laboratory examination: Secondary | ICD-10-CM

## 2021-11-24 DIAGNOSIS — Z79899 Other long term (current) drug therapy: Secondary | ICD-10-CM

## 2021-11-24 DIAGNOSIS — I5023 Acute on chronic systolic (congestive) heart failure: Secondary | ICD-10-CM

## 2021-11-24 MED ORDER — POTASSIUM CHLORIDE CRYS ER 10 MEQ PO TBCR: 10.0000 meq | EXTENDED_RELEASE_TABLET | Freq: Every day | ORAL | 3 refills | Status: DC

## 2021-11-27 ENCOUNTER — Telehealth: Payer: Self-pay

## 2021-11-27 ENCOUNTER — Other Ambulatory Visit: Payer: Self-pay | Admitting: Endocrinology

## 2021-11-27 NOTE — Telephone Encounter (Addendum)
Called and left a voice message asking patient to give me a call back at the office. Will try calling again.   ----- Message from Azalee Course, [place] sent at 11/24/2021  4:49 PM EDT ----- kidney function slightly improved, stable electrolyte.

## 2021-11-27 NOTE — Telephone Encounter (Signed)
Patient returned call and has been notified of the result and verbalized understanding.  All questions (if any) were answered. Dorris Fetch, CMA 11/27/2021 9:41 AM

## 2021-12-01 ENCOUNTER — Telehealth: Payer: Self-pay

## 2021-12-01 NOTE — Telephone Encounter (Signed)
Pharmacy requesting refill on Olmesartan. Looks like it was discontinued by another Provider. Do you want me to send refill or refuse? Please advise

## 2021-12-06 NOTE — Telephone Encounter (Signed)
Rx was sent on 12/01/21

## 2021-12-22 ENCOUNTER — Other Ambulatory Visit: Payer: Self-pay

## 2021-12-22 DIAGNOSIS — Z01812 Encounter for preprocedural laboratory examination: Secondary | ICD-10-CM

## 2021-12-22 DIAGNOSIS — Z79899 Other long term (current) drug therapy: Secondary | ICD-10-CM

## 2021-12-22 DIAGNOSIS — I5023 Acute on chronic systolic (congestive) heart failure: Secondary | ICD-10-CM

## 2021-12-22 MED ORDER — SPIRONOLACTONE 25 MG PO TABS: 12.5000 mg | ORAL_TABLET | Freq: Every day | ORAL | 0 refills | Status: DC

## 2021-12-22 MED ORDER — ENTRESTO 24-26 MG PO TABS: 1.0000 | ORAL_TABLET | Freq: Two times a day (BID) | ORAL | 0 refills | Status: DC

## 2021-12-22 NOTE — Telephone Encounter (Signed)
This is Dr. Jordan's pt. °

## 2022-01-01 ENCOUNTER — Other Ambulatory Visit: Payer: Self-pay

## 2022-01-01 DIAGNOSIS — I5023 Acute on chronic systolic (congestive) heart failure: Secondary | ICD-10-CM

## 2022-01-01 DIAGNOSIS — Z79899 Other long term (current) drug therapy: Secondary | ICD-10-CM

## 2022-01-01 DIAGNOSIS — Z01812 Encounter for preprocedural laboratory examination: Secondary | ICD-10-CM

## 2022-01-01 MED ORDER — ENTRESTO 24-26 MG PO TABS: 1.0000 | ORAL_TABLET | Freq: Two times a day (BID) | ORAL | 1 refills | Status: DC

## 2022-01-01 NOTE — Telephone Encounter (Signed)
Pt's medication was resent to pt's pharmacy as requested. Confirmation received.  °

## 2022-01-15 ENCOUNTER — Other Ambulatory Visit (INDEPENDENT_AMBULATORY_CARE_PROVIDER_SITE_OTHER): Payer: Medicare HMO

## 2022-01-15 DIAGNOSIS — E119 Type 2 diabetes mellitus without complications: Secondary | ICD-10-CM

## 2022-01-15 DIAGNOSIS — E782 Mixed hyperlipidemia: Secondary | ICD-10-CM

## 2022-01-15 LAB — COMPREHENSIVE METABOLIC PANEL
ALT: 6 U/L (ref 0–53)
AST: 11 U/L (ref 0–37)
Albumin: 3.8 g/dL (ref 3.5–5.2)
Alkaline Phosphatase: 81 U/L (ref 39–117)
BUN: 61 mg/dL — ABNORMAL HIGH (ref 6–23)
CO2: 23 mEq/L (ref 19–32)
Calcium: 9.5 mg/dL (ref 8.4–10.5)
Chloride: 107 mEq/L (ref 96–112)
Creatinine, Ser: 3.53 mg/dL — ABNORMAL HIGH (ref 0.40–1.50)
GFR: 15.53 mL/min — ABNORMAL LOW (ref >60.00)
Glucose, Bld: 96 mg/dL (ref 70–99)
Potassium: 4.5 mEq/L (ref 3.5–5.1)
Sodium: 139 mEq/L (ref 135–145)
Total Bilirubin: 0.7 mg/dL (ref 0.2–1.2)
Total Protein: 7.9 g/dL (ref 6.0–8.3)

## 2022-01-15 LAB — HEMOGLOBIN A1C: Hgb A1c MFr Bld: 6.5 % (ref 4.6–6.5)

## 2022-01-15 LAB — MICROALBUMIN / CREATININE URINE RATIO
Creatinine,U: 88.1 mg/dL
Microalb Creat Ratio: 4.7 mg/g (ref 0.0–30.0)
Microalb, Ur: 4.1 mg/dL — ABNORMAL HIGH (ref 0.0–1.9)

## 2022-01-15 LAB — LDL CHOLESTEROL, DIRECT: Direct LDL: 51 mg/dL

## 2022-01-18 ENCOUNTER — Encounter: Payer: Self-pay | Admitting: Endocrinology

## 2022-01-18 ENCOUNTER — Ambulatory Visit: Payer: Medicare HMO | Admitting: Endocrinology

## 2022-01-18 VITALS — BP 102/64 | HR 50 | Ht 71.0 in | Wt 199.8 lb

## 2022-01-18 DIAGNOSIS — E119 Type 2 diabetes mellitus without complications: Secondary | ICD-10-CM

## 2022-01-18 DIAGNOSIS — E782 Mixed hyperlipidemia: Secondary | ICD-10-CM | POA: Diagnosis not present

## 2022-01-18 DIAGNOSIS — N289 Disorder of kidney and ureter, unspecified: Secondary | ICD-10-CM

## 2022-01-18 MED ORDER — DEXCOM G7 SENSOR MISC: 1.0000 | 3 refills | Status: AC

## 2022-01-18 MED ORDER — DEXCOM G7 RECEIVER DEVI: 0 refills | Status: AC

## 2022-01-18 NOTE — Patient Instructions (Addendum)
Stop Furosemide  Always take Novolog before supper  Check blood sugars on waking up 2-3 days a week  Also check blood sugars about 2 hours after meals and do this after different meals by rotation  Recommended blood sugar levels on waking up are 80-120 and about 2 hours after meal is 130-160  Please bring your blood sugar monitor to each visit, thank you

## 2022-01-18 NOTE — Progress Notes (Signed)
Patient ID: Eric Grant, male   DOB: 06-04-1940, 81 y.o.   MRN: [id number]     Reason for Appointment: Follow-up, 58-monthvisit  History of Present Illness    Type 2 DIABETES MELITUS, date of diagnosis:  1988    Previous history: He has been on basal bolus insulin regimen for several years with excellent control He takes mealtime insulin only at breakfast and lunch Also has been taking Actoplusmet with good results for several years and no side effects  Recent history:  Insulin regimen:   TRESIBA 14 units in morning NOVOLOG insulin insulin 4 units in morning and 14 at supper  His A1c is higher than usual at 6.5, was around 6.1 before   Oral hypoglycemic drugs: none  Current blood sugar patterns, management and problems identified: His metformin and Actos.,  Metformin was stopped because of renal dysfunction Actos is likely stopped because of CHF He now says that he is not always taking his NovoLog before dinner and may take it only when the blood sugar goes up  Also not taking any for breakfast  Glucose monitoring is sporadic and only done regularly in the morning and some at bedtime  However blood sugars are rarely high and he also does not know why he had a blood sugar as low as 59 on 1 occasion fasting Because of CHF he has not exercised  Weight is down slightly recently       Monitors blood glucose: .Marland Kitchen   Glucometer:  Accu-Chek       Blood Glucose readings from meter review, download not enabled:   PRE-MEAL Fasting Lunch Dinner Bedtime Overall  Glucose range: 59-120      Mean/median:     115   POST-MEAL PC Breakfast PC Lunch PC Dinner  Glucose range:   98-203  Mean/median:      Previously  PRE-MEAL Fasting Lunch Dinner Bedtime Overall  Glucose range: 67-122      Mean/median:     113   POST-MEAL PC Breakfast PC Lunch PC Dinner  Glucose range:   185, 208  Mean/median:        Exercise: Some yard work and walking, limited by knee pain   Weight  control:    Wt Readings from Last 3 Encounters:  01/18/22 199 lb 12.8 oz (90.6 kg)  11/08/21 205 lb (93 kg)  10/30/21 204 lb 12.8 oz (92.9 kg)         Diabetes labs:  Lab Results  Component Value Date   HGBA1C 6.5 01/15/2022   HGBA1C 6.1 07/10/2021   HGBA1C 6.2 (A) 12/19/2020   Lab Results  Component Value Date   MICROALBUR 4.1 (H) 01/15/2022   LDLCALC 44 08/17/2020   CREATININE 3.53 (H) 01/15/2022    OTHER active problems: See review of systems   Allergies as of 01/18/2022   No Known Allergies      Medication List        Accurate as of January 18, 2022  9:58 AM. If you have any questions, ask your nurse or doctor.          Accu-Chek Aviva Soln Use as instructed.   True Metrix Level 1 Low Soln USE TO CALIBRATE GLUCOMETER AS DIRECTED   Accu-Chek FastClix Lancets Misc USE  TO CHECK BLOOD SUGAR FOUR TIMES DAILY   TRUEplus Lancets 28G Misc TEST BLOOD SUGAR FOUR TIMES DAILY   Accu-Chek Guide w/Device Kit USE AS DIRECTED   albuterol 108 (90 Base) MCG/ACT inhaler  Commonly known as: VENTOLIN HFA Inhale 2 puffs into the lungs every 6 (six) hours as needed for wheezing or shortness of breath.   Alcohol Swabs 70 % Pads Use as directed   allopurinol 100 MG tablet Commonly known as: ZYLOPRIM TAKE 1 TABLET EVERY DAY What changed: when to take this   aspirin EC 81 MG tablet Take 81 mg by mouth in the morning.   carvedilol 6.25 MG tablet Commonly known as: COREG Take 1 tablet (6.25 mg total) by mouth 2 (two) times daily.   Droplet Pen Needles 31G X 8 MM Misc Generic drug: Insulin Pen Needle USE 3 DAILY   empagliflozin 10 MG Tabs tablet Commonly known as: Jardiance Take 1 tablet (10 mg total) by mouth daily before breakfast.   Entresto 24-26 MG Generic drug: sacubitril-valsartan Take 1 tablet by mouth 2 (two) times daily.   furosemide 20 MG tablet Commonly known as: LASIX Take 1 tablet (20 mg) by mouth daily. May take an extra tablet for  weight gain of 3 lbs overnight and 5 lbs in a weeks.   NovoLOG FlexPen 100 UNIT/ML FlexPen Generic drug: insulin aspart INJECT 4 UNITS SUBCUTANEOUSLY AT BREAKFAST AND 14 TO 15 UNITS  AT SUPPER   olmesartan 20 MG tablet Commonly known as: BENICAR TAKE 1 TABLET EVERY DAY   potassium chloride 10 MEQ tablet Commonly known as: KLOR-CON M Take 1 tablet (10 mEq total) by mouth daily.   rosuvastatin 20 MG tablet Commonly known as: CRESTOR TAKE 1 TABLET EVERY DAY   spironolactone 25 MG tablet Commonly known as: ALDACTONE Take 0.5 tablets (12.5 mg total) by mouth daily.   Tyler Aas FlexTouch 100 UNIT/ML FlexTouch Pen Generic drug: insulin degludec INJECT 14 UNITS UNDER THE SKIN 1 TIME DAILY What changed: See the new instructions.   True Metrix Blood Glucose Test test strip Generic drug: glucose blood TEST FOUR TIMES DAILY        Allergies: No Known Allergies  Past Medical History:  Diagnosis Date   Seizures ([place]) 1991   Urinary tract infection 07/2012    Past Surgical History:  Procedure Laterality Date   RIGHT/LEFT HEART CATH AND CORONARY ANGIOGRAPHY N/A 10/23/2021   Procedure: RIGHT/LEFT HEART CATH AND CORONARY ANGIOGRAPHY;  Surgeon: [name], MD;  Location: [facility];  Service: Cardiovascular;  Laterality: N/A;    Family History  Problem Relation Age of Onset   Hyperlipidemia Mother    Diabetes Mother    Hypertension Mother    Diabetes Father    Hypertension Father    Cancer Neg Hx     Social History:  reports that he has never smoked. He has never used smokeless tobacco. No history on file for alcohol use and drug use.  Review of Systems:   Hypertension:  has had long-standing hypertension, He is on carvedilol Benicar was stopped with starting Entresto  BP checked periodically at home, recently reading about 90-100   BP Readings from Last 3 Encounters:  01/18/22 102/64  11/08/21 113/76  10/30/21 (!) 103/58   RENAL dysfunction:    No history of microalbuminuria  Lab Results  Component Value Date   CREATININE 3.53 (H) 01/15/2022   CREATININE 1.72 (H) 11/22/2021   CREATININE 1.82 (H) 11/08/2021     Lipids: LDL is excellent  with his taking Crestor 20 mg, last LDL as follows  Lab Results  Component Value Date   CHOL 97 08/17/2020   HDL 37.90 (L) 08/17/2020   LDLCALC 44 08/17/2020  LDLDIRECT 51.0 01/15/2022   TRIG 74.0 08/17/2020   CHOLHDL 3 08/17/2020      CARDIAC history:  Now taking Entresto for CHF as well as Aldactone  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, taking allopurinol with uric acid level 5  LABS:  Lab on 01/15/2022  Component Date Value Ref Range Status   Direct LDL 01/15/2022 51.0  mg/dL Final   Optimal:  <100 mg/dLNear or Above Optimal:  100-129 mg/dLBorderline High:  130-159 mg/dLHigh:  160-189 mg/dLVery High:  >190 mg/dL   Microalb, Ur 01/15/2022 4.1 (H)  0.0 - 1.9 mg/dL Final   Creatinine,U 01/15/2022 88.1  mg/dL Final   Microalb Creat Ratio 01/15/2022 4.7  0.0 - 30.0 mg/g Final   Sodium 01/15/2022 139  135 - 145 mEq/L Final   Potassium 01/15/2022 4.5  3.5 - 5.1 mEq/L Final   Chloride 01/15/2022 107  96 - 112 mEq/L Final   CO2 01/15/2022 23  19 - 32 mEq/L Final   Glucose, Bld 01/15/2022 96  70 - 99 mg/dL Final   BUN 01/15/2022 61 (H)  6 - 23 mg/dL Final   Creatinine, Ser 01/15/2022 3.53 (H)  0.40 - 1.50 mg/dL Final   Total Bilirubin 01/15/2022 0.7  0.2 - 1.2 mg/dL Final   Alkaline Phosphatase 01/15/2022 81  39 - 117 U/L Final   AST 01/15/2022 11  0 - 37 U/L Final   ALT 01/15/2022 6  0 - 53 U/L Final   Total Protein 01/15/2022 7.9  6.0 - 8.3 g/dL Final   Albumin 01/15/2022 3.8  3.5 - 5.2 g/dL Final   GFR 01/15/2022 15.53 (L)  >60.00 mL/min Final   Calculated using the CKD-EPI Creatinine Equation (2021)   Calcium 01/15/2022 9.5  8.4 - 10.5 mg/dL Final   Hgb A1c MFr Bld 01/15/2022 6.5  4.6 - 6.5 % Final   Glycemic Control Guidelines for People with Diabetes:Non  Diabetic:  <6%Goal of Therapy: <7%Additional Action Suggested:  >8%      Examination:   BP 102/64   Pulse (!) 50   Ht _0  (1.803 m)   Wt 199 lb 12.8 oz (90.6 kg)   SpO2 (!) 87%   BMI 27.87 kg/m   Body mass index is 27.87 kg/m.   Abdominal obesity present   ASSESSMENT/ PLAN:    Diabetes type 2 on insulin See history of present illness for detailed discussion of his current management, blood sugar patterns and problems identified  Blood glucose control is good with A1c 6.5  He is taking basal bolus insulin regimen with mealtime coverage using NovoLog and Tresiba Currently not on oral agents Discussed that he is not getting consistent control of his suppertime blood sugars because of waiting to like to take his NovoLog He will take this consistently before eating but adjust the dose up or down 2 units based on meal size Blood sugar targets discussed No change in [place] unless blood sugars are starting to get unusually low fasting  He is also a good candidate for continued glucose monitoring to get more complete analysis of his blood sugar patterns and help him identify high and low blood sugars, Dexcom prescription sent and he will look over the information given today To call if he needs help with starting this  RENAL: Creatinine is markedly increased and may be from overdiuresis He will for now stop furosemide and contact cardiologist regarding further management and follow-up   Hypertension: Blood pressure is low normal  Hypercholesterolemia: LDL is excellent at  51  Follow up in 3 to 4 months  There are no Patient Instructions on file for this visit.   Elayne Snare 01/18/2022, 9:58 AM

## 2022-01-19 ENCOUNTER — Telehealth: Payer: Self-pay

## 2022-01-19 NOTE — Telephone Encounter (Signed)
Called patient left message on personal voice mail to call me back about lab results.

## 2022-01-22 ENCOUNTER — Telehealth: Payer: Self-pay | Admitting: Cardiology

## 2022-01-22 ENCOUNTER — Other Ambulatory Visit: Payer: Self-pay

## 2022-01-22 DIAGNOSIS — I5022 Chronic systolic (congestive) heart failure: Secondary | ICD-10-CM

## 2022-01-22 DIAGNOSIS — N183 Chronic kidney disease, stage 3 unspecified: Secondary | ICD-10-CM

## 2022-01-22 NOTE — Telephone Encounter (Signed)
Pt returning call from 01/19/22 for lab results

## 2022-01-22 NOTE — Telephone Encounter (Signed)
Returned call to patient who states that he is returning Cheryl's call about results- advised patient I did not see recent results from Dr. Martinique but I will forward message over to her for her call patient back. Patient verbalized understanding.

## 2022-01-22 NOTE — Telephone Encounter (Signed)
Spoke to patient Dr.Jordan advised kidney functions received from Dr.Kumar worse.Advised to hold Aldactone and hold Entresto.Repeat bmet in 1 week.Appointment with Dr.Jordan moved to 10/6 at 2:40 pm.

## 2022-01-29 DIAGNOSIS — I5022 Chronic systolic (congestive) heart failure: Secondary | ICD-10-CM | POA: Diagnosis not present

## 2022-01-29 DIAGNOSIS — N183 Chronic kidney disease, stage 3 unspecified: Secondary | ICD-10-CM | POA: Diagnosis not present

## 2022-01-29 LAB — BASIC METABOLIC PANEL
BUN/Creatinine Ratio: 16 (ref 10–24)
BUN: 33 mg/dL — ABNORMAL HIGH (ref 8–27)
CO2: 17 mmol/L — ABNORMAL LOW (ref 20–29)
Calcium: 9.7 mg/dL (ref 8.6–10.2)
Chloride: 114 mmol/L — ABNORMAL HIGH (ref 96–106)
Creatinine, Ser: 2.02 mg/dL — ABNORMAL HIGH (ref 0.76–1.27)
Glucose: 105 mg/dL — ABNORMAL HIGH (ref 70–99)
Potassium: 5.1 mmol/L (ref 3.5–5.2)
Sodium: 143 mmol/L (ref 134–144)
eGFR: 33 mL/min/{1.73_m2} — ABNORMAL LOW (ref >59)

## 2022-02-07 NOTE — Progress Notes (Signed)
Cardiology Office Note   Date:  02/09/2022   ID:  Eric Grant, DOB 05-28-1940, MRN [id number]  PCP:  [name], MD  Cardiologist:   Enya Bureau Martinique, MD   Chief Complaint  Patient presents with   Congestive Heart Failure   Coronary Artery Disease      History of Present Illness: Eric Grant is a 81 y.o. male who is seen for follow up CAD and CHF. He had a Myoview study in 2007 showing inferolateral scar and EF 34%. He had an Echo in 2011 showing inferior and inferolateral scar with EF 35-40%. Patient deferred cardiac cath in the past. Last seen by [name] in 2010. He has a history of DM type 2 on insulin, HTN, HLD and CKD.   He was evaluated recently with increased SOB in the past year. Denies any chest pain. No palpitations or edema. Breathing is worse when the pollen is bad. No dizziness or syncope. Stays active maintaining 3 yards. Retired from [employer].   We arranged for Echo. This showed severe LV and RV dysfunction with EF 15%. There was severe MR. He was seen that day by [name] for evaluation. Was started on Entresto and aldactone. Coreg dose reduced. He was diuresed with lasix. Scheduled for [facility]   Heart cath showed severe CAD with 75% left main. 75% mid LCx. Subtotal RCA. Right heart pressures were normal. Filling pressures OK. Metformin stopped due to CKD.  He notes LE edema has resolved. He denies any chest pain or SOB. No palpitations.  Unfortunately aldactone and Entresto had to be discontinued due to worsening renal function. He is seen to evaluate his heart failure regimen. Repeat Echo done this week shows little change. EF < 20%. Mod-severe MR.   On follow up today he feels really well. He denies any dyspnea, edema, orthopnea or PND. Really has no symptoms in activities of daily living. Does not do yard work any more. No chest pain. Did have some lightheadedness on aldactone and Entresto- this has resolved.     Past Medical History:  Diagnosis Date    Seizures ([place]) 1991   Urinary tract infection 07/2012    Past Surgical History:  Procedure Laterality Date   RIGHT/LEFT HEART CATH AND CORONARY ANGIOGRAPHY N/A 10/23/2021   Procedure: RIGHT/LEFT HEART CATH AND CORONARY ANGIOGRAPHY;  Surgeon: [name], MD;  Location: [facility];  Service: Cardiovascular;  Laterality: N/A;     Current Outpatient Medications  Medication Sig Dispense Refill   Accu-Chek FastClix Lancets MISC USE  TO CHECK BLOOD SUGAR FOUR TIMES DAILY 400 each 2   albuterol (VENTOLIN HFA) 108 (90 Base) MCG/ACT inhaler Inhale 2 puffs into the lungs every 6 (six) hours as needed for wheezing or shortness of breath.     Alcohol Swabs 70 % PADS Use as directed 400 each 1   allopurinol (ZYLOPRIM) 100 MG tablet TAKE 1 TABLET EVERY DAY (Patient taking differently: Take 100 mg by mouth every evening.) 90 tablet 1   aspirin EC 81 MG tablet Take 81 mg by mouth in the morning.     Blood Glucose Calibration (ACCU-CHEK AVIVA) SOLN Use as instructed. 1 each 0   Blood Glucose Calibration (TRUE METRIX LEVEL 1) Low SOLN USE TO CALIBRATE GLUCOMETER AS DIRECTED 1 each 0   Blood Glucose Monitoring Suppl (ACCU-CHEK GUIDE) w/Device KIT USE AS DIRECTED 1 kit 0   carvedilol (COREG) 6.25 MG tablet Take 1 tablet (6.25 mg total) by mouth 2 (two) times  daily. 180 tablet 3   Continuous Blood Gluc Receiver (DEXCOM G7 RECEIVER) DEVI To display CGM data 1 each 0   Continuous Blood Gluc Sensor (DEXCOM G7 SENSOR) MISC 1 Device by Does not apply route as directed. Change sensor every 10 days 3 each 3   empagliflozin (JARDIANCE) 10 MG TABS tablet Take 1 tablet (10 mg total) by mouth daily before breakfast. 90 tablet 3   Insulin Pen Needle (DROPLET PEN NEEDLES) 31G X 8 MM MISC USE 3 DAILY 300 each 2   NOVOLOG FLEXPEN 100 UNIT/ML FlexPen INJECT 4 UNITS SUBCUTANEOUSLY AT BREAKFAST AND 14 TO 15 UNITS  AT SUPPER 30 mL 2   potassium chloride (KLOR-CON M) 10 MEQ tablet Take 1 tablet (10 mEq total) by  mouth daily. 90 tablet 3   rosuvastatin (CRESTOR) 20 MG tablet TAKE 1 TABLET EVERY DAY 90 tablet 1   TRESIBA FLEXTOUCH 100 UNIT/ML FlexTouch Pen INJECT 14 UNITS UNDER THE SKIN 1 TIME DAILY (Patient taking differently: 14 Units at bedtime.) 15 mL 1   TRUE METRIX BLOOD GLUCOSE TEST test strip TEST FOUR TIMES DAILY 400 strip 1   TRUEplus Lancets 28G MISC TEST BLOOD SUGAR FOUR TIMES DAILY 200 each 3   furosemide (LASIX) 20 MG tablet Take 1 tablet (20 mg) by mouth daily. May take an extra tablet for weight gain of 3 lbs overnight and 5 lbs in a weeks. (Patient not taking: Reported on 02/09/2022) 100 tablet 3   No current facility-administered medications for this visit.    Allergies:   Patient has no known allergies.    Social History:  The patient  reports that he has never smoked. He has never used smokeless tobacco.   Family History:  The patient's family history includes Diabetes in his father and mother; Hyperlipidemia in his mother; Hypertension in his father and mother.    ROS:  Please see the history of present illness.   Otherwise, review of systems are positive for none.   All other systems are reviewed and negative.    PHYSICAL EXAM: VS:  BP 138/66 (BP Location: Left Arm, Patient Position: Sitting, Cuff Size: Normal)   Pulse 60   Ht _0  (1.803 m)   Wt 201 lb 6.4 oz (91.4 kg)   SpO2 99%   BMI 28.09 kg/m  , BMI Body mass index is 28.09 kg/m. GEN: Well nourished, well developed, in no acute distress HEENT: normal Neck: no JVD, carotid bruits, or masses Cardiac: RRR; gr 2/6 holosystolic murmur LSB and apex. No edema. Respiratory:  clear to auscultation bilaterally, normal work of breathing GI: soft, nontender, nondistended, + BS MS: no deformity or atrophy Skin: warm and dry, no rash Neuro:  Strength and sensation are intact Psych: euthymic mood, full affect   EKG:  EKG is not ordered today.  Recent Labs: 10/20/2021: Platelets 256 10/23/2021: Hemoglobin 13.3;  Hemoglobin 13.3 01/15/2022: ALT 6 01/29/2022: BUN 33; Creatinine, Ser 2.02; Potassium 5.1; Sodium 143 A1c 6.1%    Lipid Panel    Component Value Date/Time   CHOL 97 08/17/2020 1347   TRIG 74.0 08/17/2020 1347   HDL 37.90 (L) 08/17/2020 1347   CHOLHDL 3 08/17/2020 1347   VLDL 14.8 08/17/2020 1347   LDLCALC 44 08/17/2020 1347   LDLDIRECT 51.0 01/15/2022 0753      Wt Readings from Last 3 Encounters:  02/09/22 201 lb 6.4 oz (91.4 kg)  01/18/22 199 lb 12.8 oz (90.6 kg)  11/08/21 205 lb (93 kg)  Other studies Reviewed: Additional studies/ records that were reviewed today include: s  Echo 10/16/21: IMPRESSIONS     1. Left ventricular ejection fraction, by estimation, is 10-15%. The left  ventricle has severely decreased function. The left ventricle demonstrates  global hypokinesis. The left ventricular internal cavity size was mildly  dilated. Left ventricular  diastolic parameters are consistent with Grade II diastolic dysfunction  (pseudonormalization).   2. Right ventricular systolic function is severely reduced. The right  ventricular size is moderately enlarged. There is mildly elevated  pulmonary artery systolic pressure. The estimated right ventricular  systolic pressure is 88.3 mmHg.   3. Left atrial size was moderately dilated.   4. Severe MR secondary to restricted PMVL in systole (IIIB). This is  secondary to cardiomyopathy. The mitral valve is abnormal. Severe mitral  valve regurgitation.   5. The aortic valve is tricuspid. Aortic valve regurgitation is not  visualized. No aortic stenosis is present.   6. The inferior vena cava is normal in size with <50% respiratory  variability, suggesting right atrial pressure of 8 mmHg.   Cardiac cath 10/23/21:  RIGHT/LEFT HEART CATH AND CORONARY ANGIOGRAPHY   Conclusion      Mid RCA lesion is 90% stenosed.   Dist RCA lesion is 99% stenosed.   Mid LM to Ost LAD lesion is 75% stenosed.   Mid Cx lesion is 75%  stenosed.   LV end diastolic pressure is normal.   There is no aortic valve stenosis.   Ao sat 97%, PA sat 64%, PA pressure 25/14, mean PA pressure 18 mmHg, mean pulmonary capillary wedge pressure 6 mmHg, cardiac output 5.01 L/min, cardiac index 2.28.   Severe three-vessel coronary artery disease, along with low ejection fraction and severe mitral regurgitation.  Right heart cath shows that he is well compensated at this time.  Discussed the case with Dr. Johney Frame who saw him in the office.  I do not think he is a candidate right now for invasive therapy with renal insufficiency along with a low EF and severe mitral regurgitation.  He is not having angina.  Continue medical therapy.  I counseled him to avoid strenuous activities.  If he starts to get short of breath, he needs to slow down.  Further plans based on how his LV recovers. Coronary Diagrams  Diagnostic Dominance: Right  Intervention    Echo 02/08/22: IMPRESSIONS     1. Left ventricular ejection fraction, by estimation, is <20%. The left  ventricle has severely decreased function. The left ventricle demonstrates  global hypokinesis. The left ventricular internal cavity size was  moderately dilated. Left ventricular  diastolic parameters are consistent with Grade II diastolic dysfunction  (pseudonormalization). No LV thrombus visualized with use of definity  contrast. There was swirling of contrast noted in the LV cavity consistent  with low flow state.   2. Right ventricular systolic function is mildly reduced. The right  ventricular size is normal. Tricuspid regurgitation signal is inadequate  for assessing PA pressure.   3. Left atrial size was moderately dilated.   4. The mitral valve is abnormal. There is tethering of the posterior  mitral valve leaflet with resultant moderate-to-severe mitral  regurgitation (appears slightly improved compared to prior TTE).   5. The aortic valve is tricuspid. There is mild  calcification of the  aortic valve. There is mild thickening of the aortic valve. Aortic valve  regurgitation is not visualized. Aortic valve sclerosis/calcification is  present, without any evidence of  aortic stenosis.  6. Aortic dilatation noted. There is borderline dilatation of the  ascending aorta, measuring 38 mm.   7. The inferior vena cava is dilated in size with >50% respiratory  variability, suggesting right atrial pressure of 8 mmHg.   Comparison(s): Prior images reviewed side by side. 10/16/21 EF 10-15%. PA  pressure 28mHg. Severe MR. Compared to prior study, the RV systolic  function appears improved on current study. Otherwise there is no  significant change.   ASSESSMENT AND PLAN:  1.  CAD. 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 No significant chest pain but now with new onset CHF symptoms. Echo with severe LV dysfunction with EF 15%. Severe MR secondary to CM. Now improved on medical therapy. Continues to deny any angina. He is not a candidate for PCI based on angiograms. Discussed the possibility of CABG but patient would be high risk for surgery based on age, CKD, and severe LV dysfunction. Would favor continued medical therapy.  2. Chronic systolic CHF. Prior EF 35%. Some increased dyspnea over the past year. Now with EF 15% with severe MR. Now on coreg, lasix, Jardiance 10 mg daily. Intolerant of Entresto and aldactone due to CKD. Will need to avoid ACEi, ARNI, aldactone in future. Will add Imdur 15 mg daily and hydralazine 25 mg tid to optimize therapy. Sodium restriction.  3. 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 Will follow on medical therapy 4. HTN well controlled. 5. HLD excellent control on statin 6. DM type 2 on insulin.  Recommend he stay off metformin given CKD and would also stop Actos with CHF. Follow up with Dr KDwyane Dee7. 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  The patient does not have concerns regarding medicines.  The following changes have been made:  no change  Labs/ tests ordered today include:   No orders of the defined types were placed in this encounter.        Disposition:   FU in 2 months  Signed, Joory Gough JMartinique MD  02/09/2022 3:22 PM    [facility] [address] [place] [address] [phone] Fax [phone]

## 2022-02-08 ENCOUNTER — Ambulatory Visit: Payer: Medicare HMO | Attending: Cardiology

## 2022-02-08 DIAGNOSIS — I255 Ischemic cardiomyopathy: Secondary | ICD-10-CM | POA: Diagnosis not present

## 2022-02-08 DIAGNOSIS — I5022 Chronic systolic (congestive) heart failure: Secondary | ICD-10-CM | POA: Insufficient documentation

## 2022-02-08 DIAGNOSIS — N183 Chronic kidney disease, stage 3 unspecified: Secondary | ICD-10-CM | POA: Diagnosis not present

## 2022-02-08 DIAGNOSIS — I2510 Atherosclerotic heart disease of native coronary artery without angina pectoris: Secondary | ICD-10-CM | POA: Diagnosis not present

## 2022-02-08 LAB — ECHOCARDIOGRAM COMPLETE
Area-P 1/2: 2 cm2
MV M vel: 5.55 m/s
MV Peak grad: 123.2 mmHg
Radius: 0.5 cm
S' Lateral: 6.4 cm

## 2022-02-08 MED ORDER — PERFLUTREN LIPID MICROSPHERE
1.0000 mL | INTRAVENOUS | Status: AC | PRN
  Administered 2022-02-08: 1 mL via INTRAVENOUS

## 2022-02-09 ENCOUNTER — Encounter: Payer: Self-pay | Admitting: Podiatry

## 2022-02-09 ENCOUNTER — Ambulatory Visit: Payer: Medicare HMO | Admitting: Cardiology

## 2022-02-09 ENCOUNTER — Ambulatory Visit (INDEPENDENT_AMBULATORY_CARE_PROVIDER_SITE_OTHER): Payer: Medicare HMO | Admitting: Podiatry

## 2022-02-09 ENCOUNTER — Encounter: Payer: Self-pay | Admitting: Cardiology

## 2022-02-09 VITALS — BP 138/66 | HR 60 | Ht 71.0 in | Wt 201.4 lb

## 2022-02-09 DIAGNOSIS — M79609 Pain in unspecified limb: Secondary | ICD-10-CM

## 2022-02-09 DIAGNOSIS — N183 Chronic kidney disease, stage 3 unspecified: Secondary | ICD-10-CM | POA: Diagnosis not present

## 2022-02-09 DIAGNOSIS — I5022 Chronic systolic (congestive) heart failure: Secondary | ICD-10-CM | POA: Diagnosis not present

## 2022-02-09 DIAGNOSIS — I2510 Atherosclerotic heart disease of native coronary artery without angina pectoris: Secondary | ICD-10-CM | POA: Diagnosis not present

## 2022-02-09 DIAGNOSIS — E119 Type 2 diabetes mellitus without complications: Secondary | ICD-10-CM

## 2022-02-09 DIAGNOSIS — I255 Ischemic cardiomyopathy: Secondary | ICD-10-CM | POA: Diagnosis not present

## 2022-02-09 DIAGNOSIS — B351 Tinea unguium: Secondary | ICD-10-CM | POA: Diagnosis not present

## 2022-02-09 MED ORDER — ISOSORBIDE MONONITRATE ER 30 MG PO TB24: 15.0000 mg | ORAL_TABLET | Freq: Every day | ORAL | 3 refills | Status: DC

## 2022-02-09 MED ORDER — HYDRALAZINE HCL 25 MG PO TABS: 25.0000 mg | ORAL_TABLET | Freq: Three times a day (TID) | ORAL | 3 refills | Status: AC

## 2022-02-09 NOTE — Patient Instructions (Signed)
Add hydralazine 25 mg three times a day  Add Imdur 15 mg daily.   We will follow up in 2 months

## 2022-02-09 NOTE — Progress Notes (Signed)
This patient returns to my office for at risk foot care.  This patient requires this care by a professional since this patient will be at risk due to having diabetes.  This patient is unable to cut nails himself since the patient cannot reach his nails.These nails are painful walking and wearing shoes.  This patient presents for at risk foot care today.  General Appearance  Alert, conversant and in no acute stress.  Vascular  Dorsalis pedis and posterior tibial  pulses are  weakly palpable  bilaterally.  Capillary return is within normal limits  bilaterally. Temperature is within normal limits  bilaterally.  Neurologic  Senn-Weinstein monofilament wire test within normal limits  bilaterally. Muscle power within normal limits bilaterally.  Nails Thick disfigured discolored nails with subungual debris  from hallux to fifth toes bilaterally. No evidence of bacterial infection or drainage bilaterally.  Orthopedic  No limitations of motion  feet .  No crepitus or effusions noted.  No bony pathology or digital deformities noted.  Skin  normotropic skin with no porokeratosis noted bilaterally.  No signs of infections or ulcers noted.     Onychomycosis  Pain in right toes  Pain in left toes  Consent was obtained for treatment procedures.   Mechanical debridement of nails 1-5  bilaterally performed with a nail nipper.  Filed with dremel without incident.    Return office visit     3 months                 Told patient to return for periodic foot care and evaluation due to potential at risk complications.   Dai Mcadams DPM   

## 2022-02-14 DIAGNOSIS — E7800 Pure hypercholesterolemia, unspecified: Secondary | ICD-10-CM | POA: Diagnosis not present

## 2022-02-14 DIAGNOSIS — I10 Essential (primary) hypertension: Secondary | ICD-10-CM | POA: Diagnosis not present

## 2022-02-14 DIAGNOSIS — Z23 Encounter for immunization: Secondary | ICD-10-CM | POA: Diagnosis not present

## 2022-02-14 DIAGNOSIS — E1122 Type 2 diabetes mellitus with diabetic chronic kidney disease: Secondary | ICD-10-CM | POA: Diagnosis not present

## 2022-02-14 DIAGNOSIS — M109 Gout, unspecified: Secondary | ICD-10-CM | POA: Diagnosis not present

## 2022-02-14 DIAGNOSIS — N1832 Chronic kidney disease, stage 3b: Secondary | ICD-10-CM | POA: Diagnosis not present

## 2022-02-14 DIAGNOSIS — I2510 Atherosclerotic heart disease of native coronary artery without angina pectoris: Secondary | ICD-10-CM | POA: Diagnosis not present

## 2022-02-14 DIAGNOSIS — Z794 Long term (current) use of insulin: Secondary | ICD-10-CM | POA: Diagnosis not present

## 2022-02-19 ENCOUNTER — Telehealth: Payer: Self-pay

## 2022-02-20 ENCOUNTER — Other Ambulatory Visit: Payer: Self-pay

## 2022-02-20 DIAGNOSIS — Z01812 Encounter for preprocedural laboratory examination: Secondary | ICD-10-CM

## 2022-02-20 DIAGNOSIS — Z79899 Other long term (current) drug therapy: Secondary | ICD-10-CM

## 2022-02-20 DIAGNOSIS — I5023 Acute on chronic systolic (congestive) heart failure: Secondary | ICD-10-CM

## 2022-02-20 MED ORDER — POTASSIUM CHLORIDE CRYS ER 10 MEQ PO TBCR: 10.0000 meq | EXTENDED_RELEASE_TABLET | Freq: Every day | ORAL | 3 refills | Status: AC

## 2022-02-21 NOTE — Telephone Encounter (Signed)
Already addressed. thanks

## 2022-02-22 ENCOUNTER — Ambulatory Visit: Payer: Medicare HMO | Admitting: Cardiology

## 2022-03-14 ENCOUNTER — Other Ambulatory Visit: Payer: Self-pay | Admitting: Cardiology

## 2022-03-14 DIAGNOSIS — Z79899 Other long term (current) drug therapy: Secondary | ICD-10-CM

## 2022-03-14 DIAGNOSIS — I5023 Acute on chronic systolic (congestive) heart failure: Secondary | ICD-10-CM

## 2022-03-14 DIAGNOSIS — Z01812 Encounter for preprocedural laboratory examination: Secondary | ICD-10-CM

## 2022-03-16 ENCOUNTER — Telehealth: Payer: Self-pay | Admitting: Cardiology

## 2022-03-16 MED ORDER — ISOSORBIDE MONONITRATE ER 30 MG PO TB24: 15.0000 mg | ORAL_TABLET | Freq: Every day | ORAL | 3 refills | Status: AC

## 2022-03-16 NOTE — Telephone Encounter (Signed)
Patient stated he didn't get the refill on Imdur. I spoke with his pharmacy who said patient needs to call in for the refill. Advised patient to call his pharmacy for the refill.

## 2022-03-16 NOTE — Telephone Encounter (Signed)
Imdur refill sent to pharmacy

## 2022-03-16 NOTE — Telephone Encounter (Signed)
Pt c/o medication issue:  1. Name of Medication:   isosorbide mononitrate (IMDUR) 30 MG 24 hr tablet   2. How are you currently taking this medication (dosage and times per day)?  Not taking yet  3. Are you having a reaction (difficulty breathing--STAT)? N/A  4. What is your medication issue?   Patient stated he never collected this medication from his pharmacy and would like the prescription re-sent to  [id number] - [place], [place] - [address]

## 2022-04-16 DIAGNOSIS — R399 Unspecified symptoms and signs involving the genitourinary system: Secondary | ICD-10-CM | POA: Diagnosis not present

## 2022-04-17 NOTE — Progress Notes (Deleted)
Cardiology Office Note   Date:  02/09/2022   ID:  Eric Grant, DOB October 28, 1940, MRN [id number]  PCP:  [name], MD  Cardiologist:   [name], MD   Chief Complaint  Patient presents with   Congestive Heart Failure   Coronary Artery Disease      History of Present Illness: Eric Grant is a 81 y.o. male who is seen for follow up CAD and CHF. He had a Myoview study in 2007 showing inferolateral scar and EF 34%. He had an Echo in 2011 showing inferior and inferolateral scar with EF 35-40%. Patient deferred cardiac cath in the past. Last seen by [name] in 2010. He has a history of DM type 2 on insulin, HTN, HLD and CKD.   He was evaluated recently with increased SOB in the past year. Denies any chest pain. No palpitations or edema. Breathing is worse when the pollen is bad. No dizziness or syncope. Stays active maintaining 3 yards. Retired from [employer].   We arranged for Echo. This showed severe LV and RV dysfunction with EF 15%. There was severe MR. He was seen that day by [name] for evaluation. Was started on Entresto and aldactone. Coreg dose reduced. He was diuresed with lasix. Scheduled for [facility].   Heart cath showed severe CAD with 75% left main. 75% mid LCx. Subtotal RCA. Right heart pressures were normal. Filling pressures OK. Metformin stopped due to CKD.  He notes LE edema has resolved. He denies any chest pain or SOB. No palpitations.  Unfortunately aldactone and Entresto had to be discontinued due to worsening renal function. He is seen to evaluate his heart failure regimen. Repeat Echo done this week shows little change. EF < 20%. Mod-severe MR.   On follow up today he feels really well. He denies any dyspnea, edema, orthopnea or PND. Really has no symptoms in activities of daily living. Does not do yard work any more. No chest pain. Did have some lightheadedness on aldactone and Entresto- this has resolved.     Past Medical History:  Diagnosis Date    Seizures (Parker) 1991   Urinary tract infection 07/2012    Past Surgical History:  Procedure Laterality Date   RIGHT/LEFT HEART CATH AND CORONARY ANGIOGRAPHY N/A 10/23/2021   Procedure: RIGHT/LEFT HEART CATH AND CORONARY ANGIOGRAPHY;  Surgeon: [name], MD;  Location: [facility];  Service: Cardiovascular;  Laterality: N/A;     Current Outpatient Medications  Medication Sig Dispense Refill   Accu-Chek FastClix Lancets MISC USE  TO CHECK BLOOD SUGAR FOUR TIMES DAILY 400 each 2   albuterol (VENTOLIN HFA) 108 (90 Base) MCG/ACT inhaler Inhale 2 puffs into the lungs every 6 (six) hours as needed for wheezing or shortness of breath.     Alcohol Swabs 70 % PADS Use as directed 400 each 1   allopurinol (ZYLOPRIM) 100 MG tablet TAKE 1 TABLET EVERY DAY (Patient taking differently: Take 100 mg by mouth every evening.) 90 tablet 1   aspirin EC 81 MG tablet Take 81 mg by mouth in the morning.     Blood Glucose Calibration (ACCU-CHEK AVIVA) SOLN Use as instructed. 1 each 0   Blood Glucose Calibration (TRUE METRIX LEVEL 1) Low SOLN USE TO CALIBRATE GLUCOMETER AS DIRECTED 1 each 0   Blood Glucose Monitoring Suppl (ACCU-CHEK GUIDE) w/Device KIT USE AS DIRECTED 1 kit 0   carvedilol (COREG) 6.25 MG tablet Take 1 tablet (6.25 mg total) by mouth 2 (two) times  daily. 180 tablet 3   Continuous Blood Gluc Receiver (DEXCOM G7 RECEIVER) DEVI To display CGM data 1 each 0   Continuous Blood Gluc Sensor (DEXCOM G7 SENSOR) MISC 1 Device by Does not apply route as directed. Change sensor every 10 days 3 each 3   empagliflozin (JARDIANCE) 10 MG TABS tablet Take 1 tablet (10 mg total) by mouth daily before breakfast. 90 tablet 3   Insulin Pen Needle (DROPLET PEN NEEDLES) 31G X 8 MM MISC USE 3 DAILY 300 each 2   NOVOLOG FLEXPEN 100 UNIT/ML FlexPen INJECT 4 UNITS SUBCUTANEOUSLY AT BREAKFAST AND 14 TO 15 UNITS  AT SUPPER 30 mL 2   potassium chloride (KLOR-CON M) 10 MEQ tablet Take 1 tablet (10 mEq total) by  mouth daily. 90 tablet 3   rosuvastatin (CRESTOR) 20 MG tablet TAKE 1 TABLET EVERY DAY 90 tablet 1   TRESIBA FLEXTOUCH 100 UNIT/ML FlexTouch Pen INJECT 14 UNITS UNDER THE SKIN 1 TIME DAILY (Patient taking differently: 14 Units at bedtime.) 15 mL 1   TRUE METRIX BLOOD GLUCOSE TEST test strip TEST FOUR TIMES DAILY 400 strip 1   TRUEplus Lancets 28G MISC TEST BLOOD SUGAR FOUR TIMES DAILY 200 each 3   furosemide (LASIX) 20 MG tablet Take 1 tablet (20 mg) by mouth daily. May take an extra tablet for weight gain of 3 lbs overnight and 5 lbs in a weeks. (Patient not taking: Reported on 02/09/2022) 100 tablet 3   No current facility-administered medications for this visit.    Allergies:   Patient has no known allergies.    Social History:  The patient  reports that he has never smoked. He has never used smokeless tobacco.   Family History:  The patient's family history includes Diabetes in his father and mother; Hyperlipidemia in his mother; Hypertension in his father and mother.    ROS:  Please see the history of present illness.   Otherwise, review of systems are positive for none.   All other systems are reviewed and negative.    PHYSICAL EXAM: VS:  BP 138/66 (BP Location: Left Arm, Patient Position: Sitting, Cuff Size: Normal)   Pulse 60   Ht _0  (1.803 m)   Wt 201 lb 6.4 oz (91.4 kg)   SpO2 99%   BMI 28.09 kg/m  , BMI Body mass index is 28.09 kg/m. GEN: Well nourished, well developed, in no acute distress HEENT: normal Neck: no JVD, carotid bruits, or masses Cardiac: RRR; gr 2/6 holosystolic murmur LSB and apex. No edema. Respiratory:  clear to auscultation bilaterally, normal work of breathing GI: soft, nontender, nondistended, + BS MS: no deformity or atrophy Skin: warm and dry, no rash Neuro:  Strength and sensation are intact Psych: euthymic mood, full affect   EKG:  EKG is not ordered today.  Recent Labs: 10/20/2021: Platelets 256 10/23/2021: Hemoglobin 13.3;  Hemoglobin 13.3 01/15/2022: ALT 6 01/29/2022: BUN 33; Creatinine, Ser 2.02; Potassium 5.1; Sodium 143 A1c 6.1%    Lipid Panel    Component Value Date/Time   CHOL 97 08/17/2020 1347   TRIG 74.0 08/17/2020 1347   HDL 37.90 (L) 08/17/2020 1347   CHOLHDL 3 08/17/2020 1347   VLDL 14.8 08/17/2020 1347   LDLCALC 44 08/17/2020 1347   LDLDIRECT 51.0 01/15/2022 0753      Wt Readings from Last 3 Encounters:  02/09/22 201 lb 6.4 oz (91.4 kg)  01/18/22 199 lb 12.8 oz (90.6 kg)  11/08/21 205 lb (93 kg)  Other studies Reviewed: Additional studies/ records that were reviewed today include: s  Echo 10/16/21: IMPRESSIONS     1. Left ventricular ejection fraction, by estimation, is 10-15%. The left  ventricle has severely decreased function. The left ventricle demonstrates  global hypokinesis. The left ventricular internal cavity size was mildly  dilated. Left ventricular  diastolic parameters are consistent with Grade II diastolic dysfunction  (pseudonormalization).   2. Right ventricular systolic function is severely reduced. The right  ventricular size is moderately enlarged. There is mildly elevated  pulmonary artery systolic pressure. The estimated right ventricular  systolic pressure is 86.7 mmHg.   3. Left atrial size was moderately dilated.   4. Severe MR secondary to restricted PMVL in systole (IIIB). This is  secondary to cardiomyopathy. The mitral valve is abnormal. Severe mitral  valve regurgitation.   5. The aortic valve is tricuspid. Aortic valve regurgitation is not  visualized. No aortic stenosis is present.   6. The inferior vena cava is normal in size with <50% respiratory  variability, suggesting right atrial pressure of 8 mmHg.   Cardiac cath 10/23/21:  RIGHT/LEFT HEART CATH AND CORONARY ANGIOGRAPHY   Conclusion      Mid RCA lesion is 90% stenosed.   Dist RCA lesion is 99% stenosed.   Mid LM to Ost LAD lesion is 75% stenosed.   Mid Cx lesion is 75%  stenosed.   LV end diastolic pressure is normal.   There is no aortic valve stenosis.   Ao sat 97%, PA sat 64%, PA pressure 25/14, mean PA pressure 18 mmHg, mean pulmonary capillary wedge pressure 6 mmHg, cardiac output 5.01 L/min, cardiac index 2.28.   Severe three-vessel coronary artery disease, along with low ejection fraction and severe mitral regurgitation.  Right heart cath shows that he is well compensated at this time.  Discussed the case with Dr. Johney Frame who saw him in the office.  I do not think he is a candidate right now for invasive therapy with renal insufficiency along with a low EF and severe mitral regurgitation.  He is not having angina.  Continue medical therapy.  I counseled him to avoid strenuous activities.  If he starts to get short of breath, he needs to slow down.  Further plans based on how his LV recovers. Coronary Diagrams  Diagnostic Dominance: Right  Intervention    Echo 02/08/22: IMPRESSIONS     1. Left ventricular ejection fraction, by estimation, is <20%. The left  ventricle has severely decreased function. The left ventricle demonstrates  global hypokinesis. The left ventricular internal cavity size was  moderately dilated. Left ventricular  diastolic parameters are consistent with Grade II diastolic dysfunction  (pseudonormalization). No LV thrombus visualized with use of definity  contrast. There was swirling of contrast noted in the LV cavity consistent  with low flow state.   2. Right ventricular systolic function is mildly reduced. The right  ventricular size is normal. Tricuspid regurgitation signal is inadequate  for assessing PA pressure.   3. Left atrial size was moderately dilated.   4. The mitral valve is abnormal. There is tethering of the posterior  mitral valve leaflet with resultant moderate-to-severe mitral  regurgitation (appears slightly improved compared to prior TTE).   5. The aortic valve is tricuspid. There is mild  calcification of the  aortic valve. There is mild thickening of the aortic valve. Aortic valve  regurgitation is not visualized. Aortic valve sclerosis/calcification is  present, without any evidence of  aortic stenosis.  6. Aortic dilatation noted. There is borderline dilatation of the  ascending aorta, measuring 38 mm.   7. The inferior vena cava is dilated in size with >50% respiratory  variability, suggesting right atrial pressure of 8 mmHg.   Comparison(s): Prior images reviewed side by side. 10/16/21 EF 10-15%. PA  pressure 65mHg. Severe MR. Compared to prior study, the RV systolic  function appears improved on current study. Otherwise there is no  significant change.   ASSESSMENT AND PLAN:  1.  CAD. 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 No significant chest pain but now with new onset CHF symptoms. Echo with severe LV dysfunction with EF 15%. Severe MR secondary to CM. Now improved on medical therapy. Continues to deny any angina. He is not a candidate for PCI based on angiograms. Discussed the possibility of CABG but patient would be high risk for surgery based on age, CKD, and severe LV dysfunction. Would favor continued medical therapy.  2. Chronic systolic CHF. Prior EF 35%. Some increased dyspnea over the past year. Now with EF 15% with severe MR. Now on coreg, lasix, Jardiance 10 mg daily. Intolerant of Entresto and aldactone due to CKD. Will need to avoid ACEi, ARNI, aldactone in future. Will add Imdur 15 mg daily and hydralazine 25 mg tid to optimize therapy. Sodium restriction.  3. 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 Will follow on medical therapy 4. HTN well controlled. 5. HLD excellent control on statin 6. DM type 2 on insulin.  Recommend he stay off metformin given CKD and would also stop Actos with CHF. Follow up with Dr KDwyane Dee7. 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  The patient does not have concerns regarding medicines.  The following changes have been made:  no change  Labs/ tests ordered today include:   No orders of the defined types were placed in this encounter.        Disposition:   FU in 2 months  Signed, Anne Sebring JMartinique MD  02/09/2022 3:22 PM    [facility] [address] [place] [address] [phone] Fax [phone]

## 2022-04-23 ENCOUNTER — Ambulatory Visit: Payer: Medicare HMO | Admitting: Cardiology

## 2022-04-25 DIAGNOSIS — I499 Cardiac arrhythmia, unspecified: Secondary | ICD-10-CM | POA: Diagnosis not present

## 2022-04-25 DIAGNOSIS — R55 Syncope and collapse: Secondary | ICD-10-CM | POA: Diagnosis not present

## 2022-05-14 ENCOUNTER — Ambulatory Visit: Payer: Medicare HMO | Admitting: Podiatry

## 2022-06-20 ENCOUNTER — Other Ambulatory Visit: Payer: Medicare HMO

## 2022-06-22 ENCOUNTER — Ambulatory Visit: Payer: Medicare HMO | Admitting: Endocrinology

## 2022-08-02 NOTE — Progress Notes (Deleted)
Cardiology Office Note   Date:  02/09/2022   ID:  Eric Grant, DOB 05-28-1940, MRN [id number]  PCP:  [name], MD  Cardiologist:   Hanzel Pizzo Martinique, MD   Chief Complaint  Patient presents with   Congestive Heart Failure   Coronary Artery Disease      History of Present Illness: Eric Grant is a 82 y.o. male who is seen for follow up CAD and CHF. He had a Myoview study in 2007 showing inferolateral scar and EF 34%. He had an Echo in 2011 showing inferior and inferolateral scar with EF 35-40%. Patient deferred cardiac cath in the past. Last seen by [name] in 2010. He has a history of DM type 2 on insulin, HTN, HLD and CKD.   He was evaluated recently with increased SOB in the past year. Denies any chest pain. No palpitations or edema. Breathing is worse when the pollen is bad. No dizziness or syncope. Stays active maintaining 3 yards. Retired from [employer].   We arranged for Echo. This showed severe LV and RV dysfunction with EF 15%. There was severe MR. He was seen that day by [name] for evaluation. Was started on Entresto and aldactone. Coreg dose reduced. He was diuresed with lasix. Scheduled for [facility]   Heart cath showed severe CAD with 75% left main. 75% mid LCx. Subtotal RCA. Right heart pressures were normal. Filling pressures OK. Metformin stopped due to CKD.  He notes LE edema has resolved. He denies any chest pain or SOB. No palpitations.  Unfortunately aldactone and Entresto had to be discontinued due to worsening renal function. He is seen to evaluate his heart failure regimen. Repeat Echo done this week shows little change. EF < 20%. Mod-severe MR.   On follow up today he feels really well. He denies any dyspnea, edema, orthopnea or PND. Really has no symptoms in activities of daily living. Does not do yard work any more. No chest pain. Did have some lightheadedness on aldactone and Entresto- this has resolved.     Past Medical History:  Diagnosis Date    Seizures ([place]) 1991   Urinary tract infection 07/2012    Past Surgical History:  Procedure Laterality Date   RIGHT/LEFT HEART CATH AND CORONARY ANGIOGRAPHY N/A 10/23/2021   Procedure: RIGHT/LEFT HEART CATH AND CORONARY ANGIOGRAPHY;  Surgeon: [name], MD;  Location: [place] [facility];  Service: Cardiovascular;  Laterality: N/A;     Current Outpatient Medications  Medication Sig Dispense Refill   Accu-Chek FastClix Lancets MISC USE  TO CHECK BLOOD SUGAR FOUR TIMES DAILY 400 each 2   albuterol (VENTOLIN HFA) 108 (90 Base) MCG/ACT inhaler Inhale 2 puffs into the lungs every 6 (six) hours as needed for wheezing or shortness of breath.     Alcohol Swabs 70 % PADS Use as directed 400 each 1   allopurinol (ZYLOPRIM) 100 MG tablet TAKE 1 TABLET EVERY DAY (Patient taking differently: Take 100 mg by mouth every evening.) 90 tablet 1   aspirin EC 81 MG tablet Take 81 mg by mouth in the morning.     Blood Glucose Calibration (ACCU-CHEK AVIVA) SOLN Use as instructed. 1 each 0   Blood Glucose Calibration (TRUE METRIX LEVEL 1) Low SOLN USE TO CALIBRATE GLUCOMETER AS DIRECTED 1 each 0   Blood Glucose Monitoring Suppl (ACCU-CHEK GUIDE) w/Device KIT USE AS DIRECTED 1 kit 0   carvedilol (COREG) 6.25 MG tablet Take 1 tablet (6.25 mg total) by mouth 2 (two) times  daily. 180 tablet 3   Continuous Blood Gluc Receiver (DEXCOM G7 RECEIVER) DEVI To display CGM data 1 each 0   Continuous Blood Gluc Sensor (DEXCOM G7 SENSOR) MISC 1 Device by Does not apply route as directed. Change sensor every 10 days 3 each 3   empagliflozin (JARDIANCE) 10 MG TABS tablet Take 1 tablet (10 mg total) by mouth daily before breakfast. 90 tablet 3   Insulin Pen Needle (DROPLET PEN NEEDLES) 31G X 8 MM MISC USE 3 DAILY 300 each 2   NOVOLOG FLEXPEN 100 UNIT/ML FlexPen INJECT 4 UNITS SUBCUTANEOUSLY AT BREAKFAST AND 14 TO 15 UNITS  AT SUPPER 30 mL 2   potassium chloride (KLOR-CON M) 10 MEQ tablet Take 1 tablet (10 mEq total) by  mouth daily. 90 tablet 3   rosuvastatin (CRESTOR) 20 MG tablet TAKE 1 TABLET EVERY DAY 90 tablet 1   TRESIBA FLEXTOUCH 100 UNIT/ML FlexTouch Pen INJECT 14 UNITS UNDER THE SKIN 1 TIME DAILY (Patient taking differently: 14 Units at bedtime.) 15 mL 1   TRUE METRIX BLOOD GLUCOSE TEST test strip TEST FOUR TIMES DAILY 400 strip 1   TRUEplus Lancets 28G MISC TEST BLOOD SUGAR FOUR TIMES DAILY 200 each 3   furosemide (LASIX) 20 MG tablet Take 1 tablet (20 mg) by mouth daily. May take an extra tablet for weight gain of 3 lbs overnight and 5 lbs in a weeks. (Patient not taking: Reported on 02/09/2022) 100 tablet 3   No current facility-administered medications for this visit.    Allergies:   Patient has no known allergies.    Social History:  The patient  reports that he has never smoked. He has never used smokeless tobacco.   Family History:  The patient's family history includes Diabetes in his father and mother; Hyperlipidemia in his mother; Hypertension in his father and mother.    ROS:  Please see the history of present illness.   Otherwise, review of systems are positive for none.   All other systems are reviewed and negative.    PHYSICAL EXAM: VS:  BP 138/66 (BP Location: Left Arm, Patient Position: Sitting, Cuff Size: Normal)   Pulse 60   Ht 5\' 11"  (1.803 m)   Wt 201 lb 6.4 oz (91.4 kg)   SpO2 99%   BMI 28.09 kg/m  , BMI Body mass index is 28.09 kg/m. GEN: Well nourished, well developed, in no acute distress HEENT: normal Neck: no JVD, carotid bruits, or masses Cardiac: RRR; gr 2/6 holosystolic murmur LSB and apex. No edema. Respiratory:  clear to auscultation bilaterally, normal work of breathing GI: soft, nontender, nondistended, + BS MS: no deformity or atrophy Skin: warm and dry, no rash Neuro:  Strength and sensation are intact Psych: euthymic mood, full affect   EKG:  EKG is not ordered today.  Recent Labs: 10/20/2021: Platelets 256 10/23/2021: Hemoglobin 13.3;  Hemoglobin 13.3 01/15/2022: ALT 6 01/29/2022: BUN 33; Creatinine, Ser 2.02; Potassium 5.1; Sodium 143 A1c 6.1%    Lipid Panel    Component Value Date/Time   CHOL 97 08/17/2020 1347   TRIG 74.0 08/17/2020 1347   HDL 37.90 (L) 08/17/2020 1347   CHOLHDL 3 08/17/2020 1347   VLDL 14.8 08/17/2020 1347   LDLCALC 44 08/17/2020 1347   LDLDIRECT 51.0 01/15/2022 0753     Dated 02/14/22: cholesterol 98, triglycerides 70, HDL 35, LDL 48. Creatinine 1.52. otherwise CMET normal.  Wt Readings from Last 3 Encounters:  02/09/22 201 lb 6.4 oz (91.4 kg)  01/18/22 199  lb 12.8 oz (90.6 kg)  11/08/21 205 lb (93 kg)      Other studies Reviewed: Additional studies/ records that were reviewed today include: s  Echo 10/16/21: IMPRESSIONS     1. Left ventricular ejection fraction, by estimation, is 10-15%. The left  ventricle has severely decreased function. The left ventricle demonstrates  global hypokinesis. The left ventricular internal cavity size was mildly  dilated. Left ventricular  diastolic parameters are consistent with Grade II diastolic dysfunction  (pseudonormalization).   2. Right ventricular systolic function is severely reduced. The right  ventricular size is moderately enlarged. There is mildly elevated  pulmonary artery systolic pressure. The estimated right ventricular  systolic pressure is [id number] mmHg.   3. Left atrial size was moderately dilated.   4. Severe MR secondary to restricted PMVL in systole (IIIB). This is  secondary to cardiomyopathy. The mitral valve is abnormal. Severe mitral  valve regurgitation.   5. The aortic valve is tricuspid. Aortic valve regurgitation is not  visualized. No aortic stenosis is present.   6. The inferior vena cava is normal in size with <50% respiratory  variability, suggesting right atrial pressure of 8 mmHg.   Cardiac cath 10/23/21:  RIGHT/LEFT HEART CATH AND CORONARY ANGIOGRAPHY   Conclusion      Mid RCA lesion is 90% stenosed.    Dist RCA lesion is 99% stenosed.   Mid LM to Ost LAD lesion is 75% stenosed.   Mid Cx lesion is 75% stenosed.   LV end diastolic pressure is normal.   There is no aortic valve stenosis.   Ao sat 97%, PA sat 64%, PA pressure 25/14, mean PA pressure 18 mmHg, mean pulmonary capillary wedge pressure 6 mmHg, cardiac output 5.01 L/min, cardiac index 2.28.   Severe three-vessel coronary artery disease, along with low ejection fraction and severe mitral regurgitation.  Right heart cath shows that he is well compensated at this time.  Discussed the case with Dr. Johney Frame who saw him in the office.  I do not think he is a candidate right now for invasive therapy with renal insufficiency along with a low EF and severe mitral regurgitation.  He is not having angina.  Continue medical therapy.  I counseled him to avoid strenuous activities.  If he starts to get short of breath, he needs to slow down.  Further plans based on how his LV recovers. Coronary Diagrams  Diagnostic Dominance: Right  Intervention    Echo 02/08/22: IMPRESSIONS     1. Left ventricular ejection fraction, by estimation, is <20%. The left  ventricle has severely decreased function. The left ventricle demonstrates  global hypokinesis. The left ventricular internal cavity size was  moderately dilated. Left ventricular  diastolic parameters are consistent with Grade II diastolic dysfunction  (pseudonormalization). No LV thrombus visualized with use of definity  contrast. There was swirling of contrast noted in the LV cavity consistent  with low flow state.   2. Right ventricular systolic function is mildly reduced. The right  ventricular size is normal. Tricuspid regurgitation signal is inadequate  for assessing PA pressure.   3. Left atrial size was moderately dilated.   4. The mitral valve is abnormal. There is tethering of the posterior  mitral valve leaflet with resultant moderate-to-severe mitral  regurgitation (appears  slightly improved compared to prior TTE).   5. The aortic valve is tricuspid. There is mild calcification of the  aortic valve. There is mild thickening of the aortic valve. Aortic valve  regurgitation is  not visualized. Aortic valve sclerosis/calcification is  present, without any evidence of  aortic stenosis.   6. Aortic dilatation noted. There is borderline dilatation of the  ascending aorta, measuring 38 mm.   7. The inferior vena cava is dilated in size with >50% respiratory  variability, suggesting right atrial pressure of 8 mmHg.   Comparison(s): Prior images reviewed side by side. 10/16/21 EF 10-15%. PA  pressure 106mmHg. Severe MR. Compared to prior study, the RV systolic  function appears improved on current study. Otherwise there is no  significant change.   ASSESSMENT AND PLAN:  1.  CAD. 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 No significant chest pain but now with new onset CHF symptoms. Echo with severe LV dysfunction with EF 15%. Severe MR secondary to CM. Now improved on medical therapy. Continues to deny any angina. He is not a candidate for PCI based on angiograms. Discussed the possibility of CABG but patient would be high risk for surgery based on age, CKD, and severe LV dysfunction. Would favor continued medical therapy.  2. Chronic systolic CHF. Prior EF 35%. Some increased dyspnea over the past year. Now with EF 15% with severe MR. Now on coreg, lasix, Jardiance 10 mg daily. Intolerant of Entresto and aldactone due to CKD. Will need to avoid ACEi, ARNI, aldactone in future. Will add Imdur 15 mg daily and hydralazine 25 mg tid to optimize therapy. Sodium restriction.  3. 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 Will follow on medical therapy 4. HTN well controlled. 5. HLD excellent control on statin 6. DM type 2 on insulin.  Recommend he stay off metformin given CKD and would also stop Actos with CHF.  Follow up with Dr Dwyane Dee 7. 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  The patient does not have concerns regarding medicines.  The following changes have been made:  no change  Labs/ tests ordered today include:   No orders of the defined types were placed in this encounter.        Disposition:   FU in 2 months  Signed, Kainat Pizana Martin[place], MD  02/09/2022 3:22 PM    [facility] [address] [place], [place], [address] Phone [phone], Fax [phone]

## 2022-08-10 ENCOUNTER — Ambulatory Visit: Payer: Self-pay | Attending: Cardiology | Admitting: Cardiology
